# Patient Record
Sex: Female | Born: 1985
Health system: Southern US, Community
[De-identification: ages and names within clinical notes are randomized; demographics above are authoritative.]

## PROBLEM LIST (undated history)

## (undated) ENCOUNTER — Inpatient Hospital Stay (HOSPITAL_COMMUNITY): Payer: Self-pay

## (undated) DIAGNOSIS — D649 Anemia, unspecified: Secondary | ICD-10-CM

## (undated) DIAGNOSIS — Z8619 Personal history of other infectious and parasitic diseases: Secondary | ICD-10-CM

## (undated) DIAGNOSIS — J40 Bronchitis, not specified as acute or chronic: Secondary | ICD-10-CM

## (undated) DIAGNOSIS — F419 Anxiety disorder, unspecified: Secondary | ICD-10-CM

## (undated) DIAGNOSIS — F32A Depression, unspecified: Secondary | ICD-10-CM

## (undated) DIAGNOSIS — F1020 Alcohol dependence, uncomplicated: Secondary | ICD-10-CM

## (undated) DIAGNOSIS — O009 Unspecified ectopic pregnancy without intrauterine pregnancy: Secondary | ICD-10-CM

## (undated) HISTORY — PX: NO PAST SURGERIES: SHX2092

## (undated) HISTORY — DX: Personal history of other infectious and parasitic diseases: Z86.19

---

## 2007-06-11 ENCOUNTER — Emergency Department (HOSPITAL_COMMUNITY): Admission: EM | Admit: 2007-06-11 | Discharge: 2007-06-11 | Payer: Self-pay | Admitting: Emergency Medicine

## 2008-09-06 ENCOUNTER — Emergency Department (HOSPITAL_COMMUNITY): Admission: EM | Admit: 2008-09-06 | Discharge: 2008-09-06 | Payer: Self-pay | Admitting: Emergency Medicine

## 2009-06-29 ENCOUNTER — Emergency Department (HOSPITAL_COMMUNITY): Admission: EM | Admit: 2009-06-29 | Discharge: 2009-06-29 | Payer: Self-pay | Admitting: Family Medicine

## 2010-03-14 ENCOUNTER — Inpatient Hospital Stay (HOSPITAL_COMMUNITY): Admission: AD | Admit: 2010-03-14 | Discharge: 2010-03-14 | Payer: Self-pay | Admitting: Obstetrics and Gynecology

## 2010-10-17 ENCOUNTER — Inpatient Hospital Stay (HOSPITAL_COMMUNITY): Admission: AD | Admit: 2010-10-17 | Discharge: 2010-10-17 | Payer: Self-pay | Admitting: Obstetrics & Gynecology

## 2010-10-29 ENCOUNTER — Inpatient Hospital Stay (HOSPITAL_COMMUNITY)
Admission: AD | Admit: 2010-10-29 | Discharge: 2010-10-29 | Payer: Self-pay | Source: Home / Self Care | Admitting: Obstetrics and Gynecology

## 2010-11-14 ENCOUNTER — Inpatient Hospital Stay (HOSPITAL_COMMUNITY)
Admission: AD | Admit: 2010-11-14 | Discharge: 2010-11-17 | Payer: Self-pay | Source: Home / Self Care | Attending: Obstetrics and Gynecology | Admitting: Obstetrics and Gynecology

## 2011-02-11 LAB — CBC
HCT: 27.1 % — ABNORMAL LOW (ref 36.0–46.0)
MCHC: 31.4 g/dL (ref 30.0–36.0)
MCV: 77.2 fL — ABNORMAL LOW (ref 78.0–100.0)
Platelets: 168 10*3/uL (ref 150–400)

## 2011-02-12 LAB — CBC
HCT: 35.3 % — ABNORMAL LOW (ref 36.0–46.0)
MCH: 25.7 pg — ABNORMAL LOW (ref 26.0–34.0)
MCHC: 31.3 g/dL (ref 30.0–36.0)
MCV: 82.2 fL (ref 78.0–100.0)
Platelets: 247 10*3/uL (ref 150–400)
WBC: 11.2 10*3/uL — ABNORMAL HIGH (ref 4.0–10.5)

## 2011-02-20 LAB — URINALYSIS, ROUTINE W REFLEX MICROSCOPIC
Glucose, UA: NEGATIVE mg/dL
Ketones, ur: 15 mg/dL — AB
Nitrite: NEGATIVE
Urobilinogen, UA: 1 mg/dL (ref 0.0–1.0)
pH: 6 (ref 5.0–8.0)

## 2011-02-20 LAB — WET PREP, GENITAL
Trich, Wet Prep: NONE SEEN
Yeast Wet Prep HPF POC: NONE SEEN

## 2011-02-20 LAB — ABO/RH: ABO/RH(D): O POS

## 2011-02-20 LAB — POCT PREGNANCY, URINE: Preg Test, Ur: POSITIVE

## 2011-11-11 ENCOUNTER — Emergency Department (INDEPENDENT_AMBULATORY_CARE_PROVIDER_SITE_OTHER)
Admission: EM | Admit: 2011-11-11 | Discharge: 2011-11-11 | Disposition: A | Discharge status: Home / Self Care | Payer: BC Managed Care – PPO | Source: Home / Self Care | Attending: Emergency Medicine | Admitting: Emergency Medicine

## 2011-11-11 ENCOUNTER — Encounter: Payer: Self-pay | Admitting: *Deleted

## 2011-11-11 DIAGNOSIS — L738 Other specified follicular disorders: Secondary | ICD-10-CM

## 2011-11-11 DIAGNOSIS — L739 Follicular disorder, unspecified: Secondary | ICD-10-CM

## 2011-11-11 DIAGNOSIS — L678 Other hair color and hair shaft abnormalities: Secondary | ICD-10-CM

## 2011-11-11 HISTORY — DX: Bronchitis, not specified as acute or chronic: J40

## 2011-11-11 MED ORDER — MUPIROCIN CALCIUM 2 % EX CREA: TOPICAL_CREAM | Freq: Three times a day (TID) | CUTANEOUS | Status: AC

## 2011-11-11 NOTE — ED Notes (Signed)
Pt  Has  sevearl  Small  Area  On  Face  And   Scalp  She  Wants  evaul  For possible  mrsa   -  Her  Child  Has  mrsa  infection

## 2011-11-11 NOTE — ED Provider Notes (Signed)
History     CSN: [id number] Arrival date & time: 11/11/2011  3:33 PM   First MD Initiated Contact with Patient 11/11/11 1409      Chief Complaint  Patient presents with  . Facial Swelling    (Consider location/radiation/quality/duration/timing/severity/associated sxs/prior treatment) HPI Comments: 4 days of tender small swelling on left temple. No redness,drainage, fevers.  no new lotions, soaps, hair pulling, trauma to area. Also c/o painless small "bump" on left eyelid present and unchanged for 2 months. is concerned that these lesions are mrsa or shingles. 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  Patient is a 25 y.o. female presenting with rash. The history is provided by the patient.  Rash  The current episode started more than 2 days ago. The problem has not changed since onset.The problem is associated with nothing. There has been no fever. The rash is present on the scalp. Pertinent negatives include no blisters, no itching, no pain and no weeping. She has tried nothing for the symptoms.    Past Medical History  Diagnosis Date  . Bronchitis     History reviewed. No pertinent past surgical history.  History reviewed. No pertinent family history.  History  Substance Use Topics  . Smoking status: Current Some Day Smoker  . Smokeless tobacco: Not on file  . Alcohol Use: No    OB History    Grav Para Term Preterm Abortions TAB SAB Ect Mult Living                  Review of Systems  Constitutional: Negative for fever and fatigue.  HENT: Negative for ear pain and facial swelling.   Gastrointestinal: Negative for nausea and vomiting.  Skin: Positive for rash. Negative for color change, itching and wound.  Neurological: Negative for headaches.    Allergies  Citrus  Home Medications   Current Outpatient Rx  Name Route Sig Dispense Refill  . MUPIROCIN CALCIUM 2 % EX CREA Topical Apply topically 3 (three) times daily. 15 g 0      BP 122/62  Pulse 90  Temp(Src) 98.7 F (37.1 C) (Oral)  Resp 18  SpO2 100%  LMP 11/11/2011  Physical Exam  Nursing note and vitals reviewed. Constitutional: She is oriented to person, place, and time. She appears well-developed and well-nourished. No distress.  HENT:  Head: Normocephalic and atraumatic.  Eyes: Conjunctivae and EOM are normal. Pupils are equal, round, and reactive to light.  Neck: Normal range of motion. Neck supple.  Cardiovascular: Regular rhythm.   Pulmonary/Chest: Effort normal.  Abdominal: She exhibits no distension.  Musculoskeletal: Normal range of motion.  Lymphadenopathy:    She has no cervical adenopathy.  Neurological: She is alert and oriented to person, place, and time.  Skin: Skin is warm and dry.       Pimple left temple no expressable drainage, no surrounding redness, central fluctuance. Small skin tag on left upper eyelid.   Psychiatric: She has a normal mood and affect. Her behavior is normal. Judgment and thought content normal.    ED Course  Procedures (including critical care time)  Labs Reviewed - No data to display No results found.   1. Folliculitis     MDM    Luiz Blare, MD 11/11/11 [phone]

## 2012-05-02 ENCOUNTER — Emergency Department (HOSPITAL_COMMUNITY)
Admission: EM | Admit: 2012-05-02 | Discharge: 2012-05-02 | Disposition: A | Discharge status: Home / Self Care | Payer: No Typology Code available for payment source | Attending: Emergency Medicine | Admitting: Emergency Medicine

## 2012-05-02 ENCOUNTER — Encounter (HOSPITAL_COMMUNITY): Payer: Self-pay | Admitting: *Deleted

## 2012-05-02 DIAGNOSIS — M545 Low back pain, unspecified: Secondary | ICD-10-CM | POA: Insufficient documentation

## 2012-05-02 DIAGNOSIS — M542 Cervicalgia: Secondary | ICD-10-CM | POA: Insufficient documentation

## 2012-05-02 DIAGNOSIS — Z043 Encounter for examination and observation following other accident: Secondary | ICD-10-CM | POA: Insufficient documentation

## 2012-05-02 DIAGNOSIS — Z87891 Personal history of nicotine dependence: Secondary | ICD-10-CM | POA: Insufficient documentation

## 2012-05-02 MED ORDER — IBUPROFEN 800 MG PO TABS: 800.0000 mg | ORAL_TABLET | Freq: Three times a day (TID) | ORAL | Status: DC | PRN

## 2012-05-02 MED ORDER — METHOCARBAMOL 500 MG PO TABS: 1000.0000 mg | ORAL_TABLET | Freq: Four times a day (QID) | ORAL | Status: DC

## 2012-05-02 MED ORDER — IBUPROFEN 800 MG PO TABS
800.0000 mg | ORAL_TABLET | Freq: Once | ORAL | Status: AC
  Administered 2012-05-02: 800 mg via ORAL
  Filled 2012-05-02: qty 1
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
## 2012-05-02 NOTE — ED Provider Notes (Signed)
Medical screening examination/treatment/procedure(s) were performed by non-physician practitioner and as supervising physician I was immediately available for consultation/collaboration.  Doug Sou, MD 05/02/12 1733
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
## 2012-05-06 ENCOUNTER — Emergency Department (INDEPENDENT_AMBULATORY_CARE_PROVIDER_SITE_OTHER)
Admission: EM | Admit: 2012-05-06 | Discharge: 2012-05-06 | Disposition: A | Discharge status: Home / Self Care | Payer: BC Managed Care – PPO | Source: Home / Self Care | Attending: Emergency Medicine | Admitting: Emergency Medicine

## 2012-05-06 ENCOUNTER — Encounter (HOSPITAL_COMMUNITY): Payer: Self-pay | Admitting: Emergency Medicine

## 2012-05-06 DIAGNOSIS — S161XXA Strain of muscle, fascia and tendon at neck level, initial encounter: Secondary | ICD-10-CM

## 2012-05-06 DIAGNOSIS — S139XXA Sprain of joints and ligaments of unspecified parts of neck, initial encounter: Secondary | ICD-10-CM

## 2012-05-06 DIAGNOSIS — S335XXA Sprain of ligaments of lumbar spine, initial encounter: Secondary | ICD-10-CM

## 2012-05-06 MED ORDER — MELOXICAM 7.5 MG PO TABS: 7.5000 mg | ORAL_TABLET | Freq: Every day | ORAL | Status: AC

## 2012-05-06 MED ORDER — METHOCARBAMOL 500 MG PO TABS: 1000.0000 mg | ORAL_TABLET | Freq: Four times a day (QID) | ORAL | Status: AC
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
## 2012-05-06 NOTE — Discharge Instructions (Signed)
  As discussed if your pain persists beyond 10-12 days should followup with your primary care Dr. or an orthopedic doctor for guided physical therapy. Your exam was not consistent with fractures or dislocations. Your muscles are tense you should take the previously prescribed muscle relaxer along with his other medicines for pain and discomfort.    Back Exercises Back exercises help treat and prevent back injuries. The goal of back exercises is to increase the strength of your abdominal and back muscles and the flexibility of your back. These exercises should be started when you no longer have back pain. Back exercises include:  Pelvic Tilt. Lie on your back with your knees bent. Tilt your pelvis until the lower part of your back is against the floor. Hold this position 5 to 10 sec and repeat 5 to 10 times.   Knee to Chest. Pull first 1 knee up against your chest and hold for 20 to 30 seconds, repeat this with the other knee, and then both knees. This may be done with the other leg straight or bent, whichever feels better.   Sit-Ups or Curl-Ups. Bend your knees 90 degrees. Start with tilting your pelvis, and do a partial, slow sit-up, lifting your trunk only 30 to 45 degrees off the floor. Take at least 2 to 3 seconds for each sit-up. Do not do sit-ups with your knees out straight. If partial sit-ups are difficult, simply do the above but with only tightening your abdominal muscles and holding it as directed.   Hip-Lift. Lie on your back with your knees flexed 90 degrees. Push down with your feet and shoulders as you raise your hips a couple inches off the floor; hold for 10 seconds, repeat 5 to 10 times.   Back arches. Lie on your stomach, propping yourself up on bent elbows. Slowly press on your hands, causing an arch in your low back. Repeat 3 to 5 times. Any initial stiffness and discomfort should lessen with repetition over time.   Shoulder-Lifts. Lie face down with arms beside your body. Keep  hips and torso pressed to floor as you slowly lift your head and shoulders off the floor.  Do not overdo your exercises, especially in the beginning. Exercises may cause you some mild back discomfort which lasts for a few minutes; however, if the pain is more severe, or lasts for more than 15 minutes, do not continue exercises until you see your caregiver. Improvement with exercise therapy for back problems is slow.  See your caregivers for assistance with developing a proper back exercise program. Document Released: 12/26/2004 Document Revised: 11/07/2011 Document Reviewed: 11/18/2005 [facility] Patient Information 2012 [place], [place].

## 2013-12-28 ENCOUNTER — Emergency Department (INDEPENDENT_AMBULATORY_CARE_PROVIDER_SITE_OTHER): Payer: BC Managed Care – PPO

## 2013-12-28 ENCOUNTER — Encounter (HOSPITAL_COMMUNITY): Payer: Self-pay | Admitting: Emergency Medicine

## 2013-12-28 ENCOUNTER — Emergency Department (INDEPENDENT_AMBULATORY_CARE_PROVIDER_SITE_OTHER)
Admission: EM | Admit: 2013-12-28 | Discharge: 2013-12-28 | Disposition: A | Discharge status: Home / Self Care | Payer: BC Managed Care – PPO | Source: Home / Self Care | Attending: Family Medicine | Admitting: Family Medicine

## 2013-12-28 DIAGNOSIS — J189 Pneumonia, unspecified organism: Secondary | ICD-10-CM

## 2013-12-28 DIAGNOSIS — Z20828 Contact with and (suspected) exposure to other viral communicable diseases: Secondary | ICD-10-CM

## 2013-12-28 MED ORDER — AZITHROMYCIN 250 MG PO TABS: ORAL_TABLET | ORAL | Status: DC

## 2013-12-28 MED ORDER — IPRATROPIUM-ALBUTEROL 0.5-2.5 (3) MG/3ML IN SOLN
RESPIRATORY_TRACT | Status: AC
  Filled 2013-12-28: qty 3

## 2013-12-28 MED ORDER — OSELTAMIVIR PHOSPHATE 75 MG PO CAPS: 75.0000 mg | ORAL_CAPSULE | Freq: Two times a day (BID) | ORAL | Status: DC

## 2013-12-28 MED ORDER — IPRATROPIUM-ALBUTEROL 0.5-2.5 (3) MG/3ML IN SOLN
3.0000 mL | Freq: Once | RESPIRATORY_TRACT | Status: AC
  Administered 2013-12-28: 3 mL via RESPIRATORY_TRACT

## 2013-12-28 NOTE — ED Provider Notes (Signed)
CSN: [id number]     Arrival date & time 12/28/13  6962 History   First MD Initiated Contact with Patient 12/28/13 1010     Chief Complaint  Patient presents with  . URI   (Consider location/radiation/quality/duration/timing/severity/associated sxs/prior Treatment) HPI Comments: 28 year old female presents complaining of one and a half days of fever, chills, body aches, nasal congestion, rhinorrhea, cough, sore throat. Started as a productive cough has gotten worse. Fever started yesterday, up to 100.5 at home. She has taken Tylenol and NyQuil, and Mucinex. The shortness of breath and it was yesterday. The body aches are worse today than the previous 2 days.  Patient is a 28 y.o. female presenting with URI. The history is limited by a language barrier.  URI Presenting symptoms: congestion, cough, ear pain, fatigue, fever, rhinorrhea and sore throat   Associated symptoms: myalgias   Associated symptoms: no arthralgias and no wheezing     Past Medical History  Diagnosis Date  . Bronchitis    History reviewed. No pertinent past surgical history. Family History  Problem Relation Age of Onset  . Stroke Mother   . Hypertension Mother   . Migraines Mother   . Seizures Mother   . Diabetes Father   . Hyperlipidemia Father   . Hypertension Father   . Rashes / Skin problems Brother   . Seizures Brother   . Rheum arthritis Neg Hx   . Osteoarthritis Neg Hx   . Asthma Neg Hx   . Cancer Neg Hx   . Heart failure Neg Hx   . Thyroid disease Neg Hx    History  Substance Use Topics  . Smoking status: Former Smoker -- 0.50 packs/day    Quit date: 12/03/2007  . Smokeless tobacco: Not on file  . Alcohol Use: 4.2 oz/week    7 Glasses of wine per week   OB History   Grav Para Term Preterm Abortions TAB SAB Ect Mult Living   4 2 2  2           Review of Systems  Constitutional: Positive for fever, chills and fatigue.  HENT: Positive for congestion, ear pain, rhinorrhea and sore throat.  Negative for sinus pressure.   Eyes: Negative for visual disturbance.  Respiratory: Positive for cough. Negative for shortness of breath and wheezing.   Cardiovascular: Negative for chest pain, palpitations and leg swelling.  Gastrointestinal: Negative for nausea, vomiting, abdominal pain and diarrhea.  Endocrine: Negative for polydipsia and polyuria.  Genitourinary: Negative for dysuria, urgency and frequency.  Musculoskeletal: Positive for myalgias. Negative for arthralgias.  Skin: Negative for rash.  Neurological: Negative for dizziness, weakness and light-headedness.    Allergies  Citrus  Home Medications   Current Outpatient Rx  Name  Route  Sig  Dispense  Refill  . azithromycin (ZITHROMAX Z-PAK) 250 MG tablet      Use as directed   6 each   0   . oseltamivir (TAMIFLU) 75 MG capsule   Oral   Take 1 capsule (75 mg total) by mouth every 12 (twelve) hours.   10 capsule   0   . OVER THE COUNTER MEDICATION   Oral   Take 2 capsules by mouth as needed. "Fat Fighter pills", taken after each heavy meal.          BP 123/92  Pulse 78  Temp(Src) 99.4 F (37.4 C) (Oral)  Resp 18  SpO2 99%  LMP 12/28/2013 Physical Exam  Nursing note and vitals reviewed. Constitutional: She is oriented  to person, place, and time. Vital signs are normal. She appears well-developed and well-nourished. No distress.  HENT:  Head: Normocephalic and atraumatic.  Right Ear: External ear normal.  Left Ear: External ear normal.  Nose: Nose normal.  Mouth/Throat: Oropharynx is clear and moist. No oropharyngeal exudate.  Eyes: Conjunctivae are normal. Right eye exhibits no discharge. Left eye exhibits no discharge. No scleral icterus.  Neck: Normal range of motion. Neck supple.  Cardiovascular: Normal rate, regular rhythm and normal heart sounds.   Pulmonary/Chest: Effort normal. No respiratory distress. She has rhonchi in the right middle field and the left middle field.  Lymphadenopathy:     She has no cervical adenopathy.  Neurological: She is alert and oriented to person, place, and time. She has normal strength. Coordination normal.  Skin: Skin is warm and dry. No rash noted. She is not diaphoretic.  Psychiatric: She has a normal mood and affect. Judgment normal.    ED Course  Procedures (including critical care time) Labs Review Labs Reviewed - No data to display Imaging Review Dg Chest 2 View  12/28/2013   CLINICAL DATA:  Cough and congestion with flu-like symptoms for 2 days  EXAM: CHEST  2 VIEW  COMPARISON:  None.  FINDINGS: The lungs are adequately inflated. There is increased density in the inferior aspect of the right upper lobe or superior segment of the right lower lobe consistent with atelectasis or early pneumonia. The left lung is clear. There is no pleural effusion or pneumothorax. The mediastinum is normal in width. The cardiac silhouette is normal in size. The observed portions of the bony thorax appear normal.  IMPRESSION: There is increased density consistent with pneumonia or atelectasis in the posterior aspect of the right mid lung as described above. Otherwise the examination is within the limits of normal.   Electronically Signed   By: David  [place]azil[place]   On: 12/28/2013 10:51      MDM   1. CAP (community acquired pneumonia)   2. Exposure to influenza    Chest x-ray reveals minimal right middle lung pneumonia. Treating for CAP and for flu given exposure to her son, just Dx with flu-like illness.  OTC medications as needed for symptoms, advised Mucinex and can use Delsym for cough.  She had significant symptomatic improvement with a duo neb, however she declines any albuterol prescription, she says she has plenty at home.   Meds ordered this encounter  Medications  . ipratropium-albuterol (DUONEB) 0.5-2.5 (3) MG/3ML nebulizer solution 3 mL    Sig:   . oseltamivir (TAMIFLU) 75 MG capsule    Sig: Take 1 capsule (75 mg total) by mouth every 12 (twelve)  hours.    Dispense:  10 capsule    Refill:  0  . azithromycin (ZITHROMAX Z-PAK) 250 MG tablet    Sig: Use as directed    Dispense:  6 each    Refill:  0        Graylon GoodZachary H Brennyn Ortlieb, PA-C 12/28/13 1153

## 2013-12-28 NOTE — Discharge Instructions (Signed)

## 2013-12-28 NOTE — ED Notes (Signed)
C/o cold and flu sx that include cough with mucous, chills, sweating, muscle aches, and congestion that started 2 days ago. Pain is 4/10. Denies any n/v/d, stated she has had more bowel movements yesterday than normal, but no diarrhea. Stated she has taking a total of 60 mg yesterday of Top Care with no relief. Written by: Marga MelnickQuaNeisha Jones, SMA

## 2013-12-29 NOTE — ED Provider Notes (Signed)
Medical screening examination/treatment/procedure(s) were performed by a resident physician or non-physician practitioner and as the supervising physician I was immediately available for consultation/collaboration.  Kalea Perine, MD    Jerney Baksh S Amoree Newlon, MD 12/29/13 0754 

## 2014-02-04 ENCOUNTER — Encounter (HOSPITAL_COMMUNITY): Payer: Self-pay | Admitting: Emergency Medicine

## 2014-02-04 ENCOUNTER — Emergency Department (HOSPITAL_COMMUNITY)
Admission: EM | Admit: 2014-02-04 | Discharge: 2014-02-04 | Disposition: A | Discharge status: Home / Self Care | Payer: BC Managed Care – PPO | Source: Home / Self Care | Attending: Family Medicine | Admitting: Family Medicine

## 2014-02-04 DIAGNOSIS — N390 Urinary tract infection, site not specified: Secondary | ICD-10-CM

## 2014-02-04 LAB — POCT URINALYSIS DIP (DEVICE)
BILIRUBIN URINE: NEGATIVE
Glucose, UA: NEGATIVE mg/dL
KETONES UR: NEGATIVE mg/dL
NITRITE: NEGATIVE
PROTEIN: 30 mg/dL — AB
SPECIFIC GRAVITY, URINE: 1.02 (ref 1.005–1.030)
Urobilinogen, UA: 1 mg/dL (ref 0.0–1.0)
pH: 8.5 — ABNORMAL HIGH (ref 5.0–8.0)

## 2014-02-04 LAB — POCT PREGNANCY, URINE: Preg Test, Ur: NEGATIVE

## 2014-02-04 MED ORDER — CEPHALEXIN 500 MG PO CAPS: 500.0000 mg | ORAL_CAPSULE | Freq: Four times a day (QID) | ORAL | Status: DC

## 2014-02-04 NOTE — Discharge Instructions (Signed)
Take all of medicine as directed, drink lots of fluids, see your doctor if further problems. °

## 2014-02-04 NOTE — ED Notes (Signed)
Pt  Reports  Symptoms  Of  Low  abd  Pain  With  Hematuria            That  She  Noticed  yest   -  Pt  States  The  Blood  Is   Prevalent  At the  End  Of  micturation          denys  Any  Discharge

## 2014-02-04 NOTE — ED Provider Notes (Signed)
CSN: [id number]     Arrival date & time 02/04/14  [id number] History   First MD Initiated Contact with Patient 02/04/14 [phone]     Chief Complaint  Patient presents with  . Hematuria   (Consider location/radiation/quality/duration/timing/severity/associated sxs/prior Treatment) Patient is a 28 y.o. female presenting with hematuria. The history is provided by the patient.  Hematuria This is a new problem. The current episode started yesterday. The problem has been gradually worsening. Associated symptoms include abdominal pain.    Past Medical History  Diagnosis Date  . Bronchitis    History reviewed. No pertinent past surgical history. Family History  Problem Relation Age of Onset  . Stroke Mother   . Hypertension Mother   . Migraines Mother   . Seizures Mother   . Diabetes Father   . Hyperlipidemia Father   . Hypertension Father   . Rashes / Skin problems Brother   . Seizures Brother   . Rheum arthritis Neg Hx   . Osteoarthritis Neg Hx   . Asthma Neg Hx   . Cancer Neg Hx   . Heart failure Neg Hx   . Thyroid disease Neg Hx    History  Substance Use Topics  . Smoking status: Former Smoker -- 0.50 packs/day    Quit date: 12/03/2007  . Smokeless tobacco: Not on file  . Alcohol Use: 4.2 oz/week    7 Glasses of wine per week   OB History   Grav Para Term Preterm Abortions TAB SAB Ect Mult Living   4 2 2  2           Review of Systems  Constitutional: Negative.   Gastrointestinal: Positive for abdominal pain.  Genitourinary: Positive for dysuria, urgency, frequency, hematuria and pelvic pain. Negative for vaginal bleeding, vaginal discharge and menstrual problem.    Allergies  Citrus  Home Medications   Current Outpatient Rx  Name  Route  Sig  Dispense  Refill  . azithromycin (ZITHROMAX Z-PAK) 250 MG tablet      Use as directed   6 each   0   . cephALEXin (KEFLEX) 500 MG capsule   Oral   Take 1 capsule (500 mg total) by mouth 4 (four) times daily. Take all of  medicine and drink lots of fluids   20 capsule   0   . oseltamivir (TAMIFLU) 75 MG capsule   Oral   Take 1 capsule (75 mg total) by mouth every 12 (twelve) hours.   10 capsule   0   . OVER THE COUNTER MEDICATION   Oral   Take 2 capsules by mouth as needed. "Fat Fighter pills", taken after each heavy meal.          There were no vitals taken for this visit. Physical Exam  Nursing note and vitals reviewed. Constitutional: She is oriented to person, place, and time. She appears well-developed and well-nourished.  Abdominal: Soft. Bowel sounds are normal. She exhibits no distension and no mass. There is tenderness in the suprapubic area. There is no rigidity, no rebound, no guarding and no CVA tenderness.    Neurological: She is alert and oriented to person, place, and time.  Skin: Skin is warm and dry.    ED Course  Procedures (including critical care time) Labs Review Labs Reviewed  POCT URINALYSIS DIP (DEVICE) - Abnormal; Notable for the following:    Hgb urine dipstick MODERATE (*)    pH 8.5 (*)    Protein, ur 30 (*)    Leukocytes,  UA SMALL (*)    All other components within normal limits  POCT PREGNANCY, URINE   Imaging Review No results found.   MDM   1. UTI (lower urinary tract infection)    U/a as noted    Linna Hoff, MD 02/04/14 1017

## 2014-07-11 ENCOUNTER — Inpatient Hospital Stay (HOSPITAL_COMMUNITY): Payer: BC Managed Care – PPO

## 2014-07-11 ENCOUNTER — Inpatient Hospital Stay (HOSPITAL_COMMUNITY)
Admission: AD | Admit: 2014-07-11 | Discharge: 2014-07-11 | Disposition: A | Discharge status: Home / Self Care | Payer: BC Managed Care – PPO | Source: Ambulatory Visit | Attending: Obstetrics & Gynecology | Admitting: Obstetrics & Gynecology

## 2014-07-11 ENCOUNTER — Encounter (HOSPITAL_COMMUNITY): Payer: Self-pay | Admitting: *Deleted

## 2014-07-11 DIAGNOSIS — Z87891 Personal history of nicotine dependence: Secondary | ICD-10-CM | POA: Insufficient documentation

## 2014-07-11 DIAGNOSIS — N83209 Unspecified ovarian cyst, unspecified side: Secondary | ICD-10-CM

## 2014-07-11 DIAGNOSIS — O34599 Maternal care for other abnormalities of gravid uterus, unspecified trimester: Secondary | ICD-10-CM | POA: Insufficient documentation

## 2014-07-11 DIAGNOSIS — O26899 Other specified pregnancy related conditions, unspecified trimester: Secondary | ICD-10-CM | POA: Diagnosis not present

## 2014-07-11 DIAGNOSIS — O99891 Other specified diseases and conditions complicating pregnancy: Secondary | ICD-10-CM | POA: Insufficient documentation

## 2014-07-11 DIAGNOSIS — O9989 Other specified diseases and conditions complicating pregnancy, childbirth and the puerperium: Principal | ICD-10-CM

## 2014-07-11 DIAGNOSIS — R109 Unspecified abdominal pain: Secondary | ICD-10-CM

## 2014-07-11 HISTORY — DX: Anemia, unspecified: D64.9

## 2014-07-11 LAB — CBC
HEMATOCRIT: 37.1 % (ref 36.0–46.0)
Hemoglobin: 11.6 g/dL — ABNORMAL LOW (ref 12.0–15.0)
MCH: 24.8 pg — ABNORMAL LOW (ref 26.0–34.0)
MCHC: 31.3 g/dL (ref 30.0–36.0)
MCV: 79.3 fL (ref 78.0–100.0)
Platelets: 321 10*3/uL (ref 150–400)
RBC: 4.68 MIL/uL (ref 3.87–5.11)
RDW: 15.2 % (ref 11.5–15.5)
WBC: 7.9 10*3/uL (ref 4.0–10.5)

## 2014-07-11 LAB — URINALYSIS, ROUTINE W REFLEX MICROSCOPIC
BILIRUBIN URINE: NEGATIVE
GLUCOSE, UA: NEGATIVE mg/dL
HGB URINE DIPSTICK: NEGATIVE
Ketones, ur: NEGATIVE mg/dL
Leukocytes, UA: NEGATIVE
Nitrite: NEGATIVE
PH: 7 (ref 5.0–8.0)
Protein, ur: NEGATIVE mg/dL
SPECIFIC GRAVITY, URINE: 1.015 (ref 1.005–1.030)
UROBILINOGEN UA: 1 mg/dL (ref 0.0–1.0)

## 2014-07-11 LAB — POCT PREGNANCY, URINE: PREG TEST UR: POSITIVE — AB

## 2014-07-11 LAB — HCG, QUANTITATIVE, PREGNANCY: hCG, Beta Chain, Quant, S: 523 m[IU]/mL — ABNORMAL HIGH (ref <5)

## 2014-07-11 LAB — WET PREP, GENITAL
CLUE CELLS WET PREP: NONE SEEN
TRICH WET PREP: NONE SEEN
Yeast Wet Prep HPF POC: NONE SEEN

## 2014-07-11 NOTE — Discharge Instructions (Signed)

## 2014-07-11 NOTE — MAU Note (Signed)
Pt LMP 06/06/14, +UPT at home.  Having lower abd pain, denies bleeding.

## 2014-07-11 NOTE — MAU Provider Note (Signed)
History     CSN: [id number]  Arrival date and time: 07/11/14 1339   First Provider Initiated Contact with Patient 07/11/14 1432      Chief Complaint  Patient presents with  . Possible Pregnancy  . Abdominal Pain   Possible Pregnancy Pertinent negatives include no abdominal pain (Occasional gas like abdominal pain. ), chills, fever, nausea or vomiting.  Abdominal Pain Pertinent negatives include no constipation, diarrhea, dysuria, fever, frequency, hematuria, nausea or vomiting.    Ms. Diana Hudson is a 28 y.o. female [phone] at [id number] who presents to MAU for a confirmation of pregnancy and to find out how far along pregnant she is. She had some "gas pain" on Saturday (2 days ago) however is not having pain today. She denies bleeding or abnormal vaginal discharge.  She is certain of her last menstrual cycle and has periods regularly.   OB History   Grav Para Term Preterm Abortions TAB SAB Ect Mult Living   5 2 2  2     2       Past Medical History  Diagnosis Date  . Bronchitis   . Anemia     Past Surgical History  Procedure Laterality Date  . No past surgeries      Family History  Problem Relation Age of Onset  . Stroke Mother   . Hypertension Mother   . Migraines Mother   . Seizures Mother   . Diabetes Father   . Hyperlipidemia Father   . Hypertension Father   . Rashes / Skin problems Brother   . Seizures Brother   . Rheum arthritis Neg Hx   . Osteoarthritis Neg Hx   . Asthma Neg Hx   . Cancer Neg Hx   . Heart failure Neg Hx   . Thyroid disease Neg Hx     History  Substance Use Topics  . Smoking status: Former Smoker -- 0.50 packs/day    Quit date: 12/03/2007  . Smokeless tobacco: Not on file  . Alcohol Use: 4.2 oz/week    7 Glasses of wine per week     Comment: stopped with +UPT    Allergies:  Allergies  Allergen Reactions  . Citrus Other (See Comments)    Gets blisters around her mouth.    Prescriptions prior to admission  Medication Sig  Dispense Refill  . triamcinolone ointment (KENALOG) 0.1 % Apply 1 application topically as needed (rash and itching).       Results for orders placed during the hospital encounter of 07/11/14 (from the past 48 hour(s))  URINALYSIS, ROUTINE W REFLEX MICROSCOPIC     Status: None   Collection Time    07/11/14  1:45 PM      Result Value Ref Range   Color, Urine YELLOW  YELLOW   APPearance CLEAR  CLEAR   Specific Gravity, Urine 1.015  1.005 - 1.030   pH 7.0  5.0 - 8.0   Glucose, UA NEGATIVE  NEGATIVE mg/dL   Hgb urine dipstick NEGATIVE  NEGATIVE   Bilirubin Urine NEGATIVE  NEGATIVE   Ketones, ur NEGATIVE  NEGATIVE mg/dL   Protein, ur NEGATIVE  NEGATIVE mg/dL   Urobilinogen, UA 1.0  0.0 - 1.0 mg/dL   Nitrite NEGATIVE  NEGATIVE   Leukocytes, UA NEGATIVE  NEGATIVE   Comment: MICROSCOPIC NOT DONE ON URINES WITH NEGATIVE PROTEIN, BLOOD, LEUKOCYTES, NITRITE, OR GLUCOSE <1000 mg/dL.  POCT PREGNANCY, URINE     Status: Abnormal   Collection Time    07/11/14  1:51  PM      Result Value Ref Range   Preg Test, Ur POSITIVE (*) NEGATIVE   Comment:            THE SENSITIVITY OF THIS     METHODOLOGY IS >24 mIU/mL  CBC     Status: Abnormal   Collection Time    07/11/14  3:10 PM      Result Value Ref Range   WBC 7.9  4.0 - 10.5 K/uL   RBC 4.68  3.87 - 5.11 MIL/uL   Hemoglobin 11.6 (*) 12.0 - 15.0 g/dL   HCT 16.1  09.6 - 04.5 %   MCV 79.3  78.0 - 100.0 fL   MCH 24.8 (*) 26.0 - 34.0 pg   MCHC 31.3  30.0 - 36.0 g/dL   RDW 40.9  81.1 - 91.4 %   Platelets 321  150 - 400 K/uL  HCG, QUANTITATIVE, PREGNANCY     Status: Abnormal   Collection Time    07/11/14  3:10 PM      Result Value Ref Range   hCG, Beta Chain, Quant, S 523 (*) <5 mIU/mL   Comment:              GEST. AGE      CONC.  (mIU/mL)       <=1 WEEK        5 - 50         2 WEEKS       50 - 500         3 WEEKS       100 - 10,000         4 WEEKS     1,000 - 30,000         5 WEEKS     3,500 - 115,000       6-8 WEEKS     12,000 - 270,000         12 WEEKS     15,000 - 220,000                FEMALE AND NON-PREGNANT FEMALE:         LESS THAN 5 mIU/mL  WET PREP, GENITAL     Status: Abnormal   Collection Time    07/11/14  3:24 PM      Result Value Ref Range   Yeast Wet Prep HPF POC NONE SEEN  NONE SEEN   Trich, Wet Prep NONE SEEN  NONE SEEN   Clue Cells Wet Prep HPF POC NONE SEEN  NONE SEEN   WBC, Wet Prep HPF POC FEW (*) NONE SEEN   Comment: FEW BACTERIA SEEN   US Ob Comp Less 14 Wks  07/11/2014   CLINICAL DATA:  Positive HCG with pelvic pain  EXAM: OBSTETRIC <14 WK [place] AND TRANSVAGINAL OB US  TECHNIQUE: Study was performed transabdominally to optimize pelvic field of view evaluation and transvaginally to optimize internal visceral architecture evaluation.  COMPARISON:  None.  FINDINGS: Intrauterine gestational sac: Not visualized  Yolk sac:  Not visualized  Embryo:  Not visualized  Maternal uterus/adnexae: Uterus measures 5.7 x 5.7 x 4.6 cm. Uterus is anteverted. There is no demonstrable intrauterine mass. Endometrium is thickened measuring 19 mm with a smooth contour.  Left ovary measures 2.9 x 1.8 x 1.7 cm. Right ovary measures 7.6 x 6.6 x 6.4 cm. There is a mildly complex cyst in the right ovary measuring 5.9 x 5.8 x 6.3 cm. There is no other pelvic or  adnexal mass. No free pelvic fluid.  IMPRESSION: Probable dominant hemorrhagic cyst right ovary. No other pelvic masses. No intrauterine gestation seen at this time. Differential considerations for this circumstance include gestation too early to be seen by either transabdominal technique; recent spontaneous abortion, or possible ectopic gestation. Given this circumstance, close clinical and laboratory evaluation with beta HCG values advised. Repeat imaging in part will be depend on beta HCG values. The dominant cystic area on the right will warrant surveillance apart from beta HCG values. In this regard, a followup ultrasound in 4-6 weeks to evaluate this sizable mildly complex cystic mass  would be advisable. If symptoms persist, earlier surveillance of this lesion may be advisable.   Electronically Signed   By: Bretta Bang M.D.   On: 07/11/2014 16:30   US Ob Transvaginal  07/11/2014   CLINICAL DATA:  Positive HCG with pelvic pain  EXAM: OBSTETRIC <14 WK [place] AND TRANSVAGINAL OB US  TECHNIQUE: Study was performed transabdominally to optimize pelvic field of view evaluation and transvaginally to optimize internal visceral architecture evaluation.  COMPARISON:  None.  FINDINGS: Intrauterine gestational sac: Not visualized  Yolk sac:  Not visualized  Embryo:  Not visualized  Maternal uterus/adnexae: Uterus measures 5.7 x 5.7 x 4.6 cm. Uterus is anteverted. There is no demonstrable intrauterine mass. Endometrium is thickened measuring 19 mm with a smooth contour.  Left ovary measures 2.9 x 1.8 x 1.7 cm. Right ovary measures 7.6 x 6.6 x 6.4 cm. There is a mildly complex cyst in the right ovary measuring 5.9 x 5.8 x 6.3 cm. There is no other pelvic or adnexal mass. No free pelvic fluid.  IMPRESSION: Probable dominant hemorrhagic cyst right ovary. No other pelvic masses. No intrauterine gestation seen at this time. Differential considerations for this circumstance include gestation too early to be seen by either transabdominal technique; recent spontaneous abortion, or possible ectopic gestation. Given this circumstance, close clinical and laboratory evaluation with beta HCG values advised. Repeat imaging in part will be depend on beta HCG values. The dominant cystic area on the right will warrant surveillance apart from beta HCG values. In this regard, a followup ultrasound in 4-6 weeks to evaluate this sizable mildly complex cystic mass would be advisable. If symptoms persist, earlier surveillance of this lesion may be advisable.   Electronically Signed   By: Bretta Bang M.D.   On: 07/11/2014 16:30     Review of Systems  Constitutional: Negative for fever and chills.  Gastrointestinal:  Negative for nausea, vomiting, abdominal pain (Occasional gas like abdominal pain. ), diarrhea and constipation.  Genitourinary: Negative for dysuria, urgency, frequency and hematuria.       No vaginal discharge. No vaginal bleeding. No dysuria.    Physical Exam   Blood pressure 129/71, pulse 79, temperature 98.7 F (37.1 C), temperature source Oral, resp. rate 18, height 5\' 3"  (1.6 m), weight 99.973 kg (220 lb 6.4 oz), last menstrual period 06/06/2014.  Physical Exam  Constitutional: She is oriented to person, place, and time. She appears well-developed and well-nourished. No distress.  Neck: Neck supple.  Respiratory: Effort normal.  GI: Soft. There is tenderness.  Right-mid abdominal tenderness. Patient rates her pain 5/10 on exam when abdomen is palpated.   Genitourinary:  Speculum exam: Vagina - Small amount of creamy discharge, no odor Cervix - No contact bleeding, no active bleeding  Bimanual exam: Cervix closed Uterus non tender, normal size Adnexa non tender, no masses bilaterally. No tenderness in right adnexa  GC/Chlam,  wet prep done Chaperone present for exam.   Musculoskeletal: Normal range of motion.  Neurological: She is alert and oriented to person, place, and time.  Skin: Skin is warm. She is not diaphoretic.  Psychiatric: Her behavior is normal.    MAU Course  Procedures None   MDM Wet prep GC CBC Beta hcg Ultrasound   O positive blood type  Discussed US findings with Dr. Debroah Loop. Plan to follow the patient with Beta hcg levels to rule out ectopic pregnancy. Pt will need to have follow up for cyst in the next 6-8 weeks. Pt plans to see [facility] for her pregnancy and is instructed to discussed follow up at her first OB appointment.   Assessment and Plan   A:  Right hemorraghic cyst  Abdominal pain in pregnancy; cannot exclude ectopic pregnancy   P:  Discharge home in stable condition Return to MAU as needed, if symptoms  worsen Ectopic precautions discussed Pelvic rest Ok to take tylenol as needed, as directed on the bottle for pain.  Return to MAU in 48 hours for repeat Beta hcg level.   Iona Hansen Rasch, NP  07/11/2014, 7:32 PM

## 2014-07-12 LAB — GC/CHLAMYDIA PROBE AMP
CT PROBE, AMP APTIMA: NEGATIVE
GC Probe RNA: NEGATIVE

## 2014-07-13 ENCOUNTER — Inpatient Hospital Stay (HOSPITAL_COMMUNITY)
Admission: AD | Admit: 2014-07-13 | Discharge: 2014-07-13 | Disposition: A | Discharge status: Home / Self Care | Payer: BC Managed Care – PPO | Source: Ambulatory Visit | Attending: Obstetrics & Gynecology | Admitting: Obstetrics & Gynecology

## 2014-07-13 DIAGNOSIS — O9989 Other specified diseases and conditions complicating pregnancy, childbirth and the puerperium: Principal | ICD-10-CM

## 2014-07-13 DIAGNOSIS — O99891 Other specified diseases and conditions complicating pregnancy: Secondary | ICD-10-CM | POA: Diagnosis not present

## 2014-07-13 DIAGNOSIS — O0281 Inappropriate change in quantitative human chorionic gonadotropin (hCG) in early pregnancy: Secondary | ICD-10-CM

## 2014-07-13 LAB — HCG, QUANTITATIVE, PREGNANCY: hCG, Beta Chain, Quant, S: 1490 m[IU]/mL — ABNORMAL HIGH (ref <5)

## 2014-07-13 NOTE — MAU Note (Signed)
Feeling much better than on Monday. Denies any pain or bleeding.

## 2014-07-13 NOTE — MAU Provider Note (Signed)
Subjective: [facility] b[facility].  She denies further pain, bleeding.    Objective:  Quant on 07/11/14 was 523. Results for orders placed during the hospital encounter of 07/13/14 (from the past 24 hour(s))  HCG, QUANTITATIVE, PREGNANCY     Status: Abnormal   Collection Time    07/13/14  5:20 PM      Result Value Ref Range   hCG, Beta Chain, Quant, S 1490 (*) <5 mIU/mL    Filed Vitals:   07/13/14 1830  BP: 125/84  Pulse: 73  Temp: 98.9 F (37.2 C)  Resp: 18   General: well developed, well nourished, no acute distress Abdomen: without tenderness  Assessment: 5 weeks 2 days appropriate rise in bHCG level.    Plan: Schedule follow up ultrasound in 7 days.  U/S to call pt. Return to MAU for pain or bleeding.   [facility] Notify ob/gyn of cyst.    Nada MaclachlanKaren Teague Clark, PA-C 07/13/2014 7:28 PM

## 2014-07-26 ENCOUNTER — Encounter (HOSPITAL_COMMUNITY): Payer: Self-pay | Admitting: *Deleted

## 2014-07-26 ENCOUNTER — Inpatient Hospital Stay (HOSPITAL_COMMUNITY)
Admission: AD | Admit: 2014-07-26 | Discharge: 2014-07-26 | Disposition: A | Discharge status: Home / Self Care | Payer: BC Managed Care – PPO | Source: Ambulatory Visit | Attending: Obstetrics & Gynecology | Admitting: Obstetrics & Gynecology

## 2014-07-26 ENCOUNTER — Inpatient Hospital Stay (HOSPITAL_COMMUNITY): Payer: BC Managed Care – PPO

## 2014-07-26 DIAGNOSIS — Z87891 Personal history of nicotine dependence: Secondary | ICD-10-CM | POA: Insufficient documentation

## 2014-07-26 DIAGNOSIS — O209 Hemorrhage in early pregnancy, unspecified: Secondary | ICD-10-CM | POA: Insufficient documentation

## 2014-07-26 DIAGNOSIS — O4690 Antepartum hemorrhage, unspecified, unspecified trimester: Secondary | ICD-10-CM

## 2014-07-26 NOTE — MAU Note (Signed)
Patient states she had bright red bleeding in her panties and on tissue with wiping. Not wearing a pad at this time. Some off and on cramping.

## 2014-07-26 NOTE — MAU Provider Note (Signed)
History     CSN: [id number]  Arrival date and time: 07/26/14 1618   None     Chief Complaint  Patient presents with  . Vaginal Bleeding   HPI  Pt is [id number] @ [redacted]w[redacted]d pregnant initially seen on 07/11/2014 for evaluation of pain.  Pt had an HCG of 523 with f/u HCG on 07/13/2014 of 1490.  Pt had ultrasound on 8/10 that showed no IUGS at the time and dominant cystic area on the right. Pt had neg wet prep and GC/chlamydia when she was seen.   Pt had bleeding today when she wiped. Pt had IC last night and onset of bleeding this afternoon at 4 pm.  Pt has an appointment for New OB at [facility] RN note: Patient states she had bright red bleeding in her panties and on tissue with wiping. Not wearing a pad at this time. Some off and on cramping  Past Medical History  Diagnosis Date  . Bronchitis   . Anemia     Past Surgical History  Procedure Laterality Date  . No past surgeries      Family History  Problem Relation Age of Onset  . Stroke Mother   . Hypertension Mother   . Migraines Mother   . Seizures Mother   . Diabetes Father   . Hyperlipidemia Father   . Hypertension Father   . Rashes / Skin problems Brother   . Seizures Brother   . Rheum arthritis Neg Hx   . Osteoarthritis Neg Hx   . Asthma Neg Hx   . Cancer Neg Hx   . Heart failure Neg Hx   . Thyroid disease Neg Hx     History  Substance Use Topics  . Smoking status: Former Smoker -- 0.50 packs/day    Quit date: 12/03/2007  . Smokeless tobacco: Not on file  . Alcohol Use: 4.2 oz/week    7 Glasses of wine per week     Comment: stopped with +UPT    Allergies:  Allergies  Allergen Reactions  . Citrus Other (See Comments)    Gets blisters around her mouth.    Prescriptions prior to admission  Medication Sig Dispense Refill  . triamcinolone ointment (KENALOG) 0.1 % Apply 1 application topically as needed (rash and itching).        Review of Systems  Constitutional: Negative for fever and chills.   Gastrointestinal: Positive for nausea. Negative for vomiting, abdominal pain, diarrhea and constipation.  Genitourinary: Negative for dysuria and urgency.   Physical Exam   Blood pressure 127/76, pulse 79, temperature 98.4 F (36.9 C), temperature source Oral, resp. rate 16, height  (1.6 m), weight 219 lb 12.8 oz (99.701 kg), last menstrual period 06/06/2014, SpO2 100.00%.  Physical Exam  Nursing note and vitals reviewed. Constitutional: She is oriented to person, place, and time. She appears well-developed and well-nourished. No distress.  HENT:  Head: Normocephalic.  Neck: Normal range of motion. Neck supple.  Cardiovascular: Normal rate.   Respiratory: Effort normal.  GI: Soft. She exhibits no distension. There is no tenderness. There is no rebound.  Genitourinary:  Small amount of bright red blood in vault; cervix closed, NT ; uterus 7 weeks size, nontender  Musculoskeletal: Normal range of motion.  Neurological: She is alert and oriented to person, place, and time.  Skin: Skin is warm and dry.  Psychiatric: She has a normal mood and affect.    MAU Course  Procedures US Ob Transvaginal  07/26/2014  CLINICAL DATA:  Vaginal bleeding, positive pregnancy test  EXAM: TRANSVAGINAL OB ULTRASOUND  TECHNIQUE: Transvaginal ultrasound was performed for complete evaluation of the gestation as well as the maternal uterus, adnexal regions, and pelvic cul-de-sac.  COMPARISON:  None.  FINDINGS: Intrauterine gestational sac: Visualized/normal in shape.  Yolk sac:  Visualized  Embryo:  Visualized  Cardiac Activity: Visualized  Heart Rate: 120 bpm  CRL:   6  mm   6 w 4d                  [place] EDC: 03/17/15  Maternal uterus/adnexae: Right ovarian simple appearing cyst measures 6.6 x 6.6 x 5.7 cm. Left ovary is normal. No free fluid.  IMPRESSION: Intrauterine gestational sac, yolk sac, and fetal pole with cardiac activity, 6 weeks 4 days by today's exam. No acute abnormality.   Electronically Signed    By: Christiana Pellant M.D.   On: 07/26/2014 17:09     Assessment and Plan  Single living IUGS with YS and fetal pole with cardiac activity [redacted]w[redacted]d Bleeding in pregnancy F/u with OB provider  LINEBERRY,SUSAN 07/26/2014, 5:18 PM

## 2014-07-28 NOTE — MAU Provider Note (Signed)
Attestation of Attending Supervision of Advanced Practitioner (PA/CNM/NP): Evaluation and management procedures were performed by the Advanced Practitioner under my supervision and collaboration.  I have reviewed the Advanced Practitioner's note and chart, and I agree with the management and plan.  Dajanay Northrup, MD, FACOG Attending Obstetrician & Gynecologist Faculty Practice, [facility]   

## 2014-10-03 ENCOUNTER — Encounter (HOSPITAL_COMMUNITY): Payer: Self-pay | Admitting: *Deleted

## 2014-10-18 LAB — OB RESULTS CONSOLE ABO/RH: RH TYPE: POSITIVE

## 2014-10-18 LAB — OB RESULTS CONSOLE ANTIBODY SCREEN: ANTIBODY SCREEN: NEGATIVE

## 2014-10-18 LAB — OB RESULTS CONSOLE HEPATITIS B SURFACE ANTIGEN: HEP B S AG: NEGATIVE

## 2014-10-18 LAB — OB RESULTS CONSOLE GC/CHLAMYDIA
Chlamydia: NEGATIVE
Gonorrhea: NEGATIVE

## 2014-10-18 LAB — OB RESULTS CONSOLE RPR: RPR: NONREACTIVE

## 2014-10-18 LAB — OB RESULTS CONSOLE RUBELLA ANTIBODY, IGM: RUBELLA: IMMUNE

## 2014-10-18 LAB — OB RESULTS CONSOLE HIV ANTIBODY (ROUTINE TESTING): HIV: NONREACTIVE

## 2014-12-02 NOTE — L&D Delivery Note (Signed)
Operative Delivery Note Patient pushed for less than five minutes and at 11:41 PM a viable and healthy female was delivered via Vaginal, Spontaneous Delivery.  Presentation: vertex; Position: Right,, Occiput,, Anterior;  With the delivery of the head the anterior shoulder did not deliver spontaneously with gentle downward traction and a shoulder dystocia was identified. There was also a notable tight nuchal cord.  The cord was double clamped on the neck and cut, the shoulder was delivered with McRoberts maneuver. The rest of the body was easily delivered.  Peds were called to delivery.  Cord gases were obtained.  Infant noted to have vigorous cry and moving all four extremities well.   Delivery of the head:   , McRoberts Second maneuver: not needed        APGAR: pending per PEDS Placenta status: Intact, Spontaneous.   Cord: 3 vessels with the following complications: None.  Cord pH: pending Anesthesia: Epidural  Episiotomy: None Lacerations:    Est. Blood Loss (mL): 300  Mom to postpartum.  Baby to Couplet care / Skin to Skin.  Essie HartINN, Elexis Pollak STACIA 03/08/2015, 12:01 AM

## 2015-02-17 LAB — OB RESULTS CONSOLE GBS: GBS: POSITIVE

## 2015-02-28 ENCOUNTER — Encounter (HOSPITAL_COMMUNITY): Payer: Self-pay | Admitting: *Deleted

## 2015-02-28 ENCOUNTER — Telehealth (HOSPITAL_COMMUNITY): Payer: Self-pay | Admitting: *Deleted

## 2015-02-28 NOTE — Telephone Encounter (Signed)
Preadmission screen  

## 2015-03-07 ENCOUNTER — Encounter (HOSPITAL_COMMUNITY): Payer: Self-pay

## 2015-03-07 ENCOUNTER — Inpatient Hospital Stay (HOSPITAL_COMMUNITY): Payer: Medicaid Other | Admitting: Anesthesiology

## 2015-03-07 ENCOUNTER — Inpatient Hospital Stay (HOSPITAL_COMMUNITY)
Admission: RE | Admit: 2015-03-07 | Discharge: 2015-03-09 | DRG: 775 | Disposition: A | Discharge status: Home / Self Care | Payer: Medicaid Other | Source: Ambulatory Visit | Attending: Obstetrics & Gynecology | Admitting: Obstetrics & Gynecology

## 2015-03-07 DIAGNOSIS — Z87891 Personal history of nicotine dependence: Secondary | ICD-10-CM

## 2015-03-07 DIAGNOSIS — Z823 Family history of stroke: Secondary | ICD-10-CM

## 2015-03-07 DIAGNOSIS — Z3A39 39 weeks gestation of pregnancy: Secondary | ICD-10-CM | POA: Diagnosis present

## 2015-03-07 DIAGNOSIS — IMO0002 Reserved for concepts with insufficient information to code with codable children: Secondary | ICD-10-CM | POA: Diagnosis present

## 2015-03-07 DIAGNOSIS — Z8249 Family history of ischemic heart disease and other diseases of the circulatory system: Secondary | ICD-10-CM | POA: Diagnosis not present

## 2015-03-07 DIAGNOSIS — O99824 Streptococcus B carrier state complicating childbirth: Secondary | ICD-10-CM | POA: Diagnosis present

## 2015-03-07 DIAGNOSIS — Z833 Family history of diabetes mellitus: Secondary | ICD-10-CM

## 2015-03-07 DIAGNOSIS — O99214 Obesity complicating childbirth: Secondary | ICD-10-CM | POA: Diagnosis present

## 2015-03-07 DIAGNOSIS — O9989 Other specified diseases and conditions complicating pregnancy, childbirth and the puerperium: Secondary | ICD-10-CM | POA: Diagnosis present

## 2015-03-07 DIAGNOSIS — Z6841 Body Mass Index (BMI) 40.0 and over, adult: Secondary | ICD-10-CM | POA: Diagnosis not present

## 2015-03-07 DIAGNOSIS — O3663X0 Maternal care for excessive fetal growth, third trimester, not applicable or unspecified: Principal | ICD-10-CM | POA: Diagnosis present

## 2015-03-07 DIAGNOSIS — Z3490 Encounter for supervision of normal pregnancy, unspecified, unspecified trimester: Secondary | ICD-10-CM

## 2015-03-07 LAB — CBC
HCT: 34.7 % — ABNORMAL LOW (ref 36.0–46.0)
Hemoglobin: 10.9 g/dL — ABNORMAL LOW (ref 12.0–15.0)
MCH: 24.5 pg — AB (ref 26.0–34.0)
MCHC: 31.4 g/dL (ref 30.0–36.0)
MCV: 78 fL (ref 78.0–100.0)
Platelets: 236 10*3/uL (ref 150–400)
RBC: 4.45 MIL/uL (ref 3.87–5.11)
RDW: 15.8 % — ABNORMAL HIGH (ref 11.5–15.5)
WBC: 8.8 10*3/uL (ref 4.0–10.5)

## 2015-03-07 LAB — TYPE AND SCREEN
ABO/RH(D): O POS
ANTIBODY SCREEN: NEGATIVE

## 2015-03-07 LAB — RPR: RPR Ser Ql: NONREACTIVE

## 2015-03-07 MED ORDER — PHENYLEPHRINE 40 MCG/ML (10ML) SYRINGE FOR IV PUSH (FOR BLOOD PRESSURE SUPPORT)
80.0000 ug | PREFILLED_SYRINGE | INTRAVENOUS | Status: DC | PRN
  Administered 2015-03-07 (×2): 80 ug via INTRAVENOUS

## 2015-03-07 MED ORDER — PHENYLEPHRINE 40 MCG/ML (10ML) SYRINGE FOR IV PUSH (FOR BLOOD PRESSURE SUPPORT)
80.0000 ug | PREFILLED_SYRINGE | INTRAVENOUS | Status: DC | PRN
  Filled 2015-03-07: qty 20

## 2015-03-07 MED ORDER — LACTATED RINGERS IV SOLN
500.0000 mL | Freq: Once | INTRAVENOUS | Status: AC
  Administered 2015-03-07: 500 mL via INTRAVENOUS

## 2015-03-07 MED ORDER — EPHEDRINE 5 MG/ML INJ
10.0000 mg | INTRAVENOUS | Status: DC | PRN
Start: 2015-03-07 — End: 2015-03-07

## 2015-03-07 MED ORDER — LIDOCAINE HCL (PF) 1 % IJ SOLN
INTRAMUSCULAR | Status: DC | PRN
  Administered 2015-03-07 (×2): 9 mL

## 2015-03-07 MED ORDER — CITRIC ACID-SODIUM CITRATE 334-500 MG/5ML PO SOLN: 30.0000 mL | ORAL | Status: DC | PRN

## 2015-03-07 MED ORDER — FENTANYL 2.5 MCG/ML BUPIVACAINE 1/10 % EPIDURAL INFUSION (WH - ANES)
INTRAMUSCULAR | Status: DC | PRN
  Administered 2015-03-07: 14 mL/h via EPIDURAL

## 2015-03-07 MED ORDER — LACTATED RINGERS IV SOLN
INTRAVENOUS | Status: DC
  Administered 2015-03-07 (×3): via INTRAVENOUS

## 2015-03-07 MED ORDER — DIPHENHYDRAMINE HCL 50 MG/ML IJ SOLN: 12.5000 mg | INTRAMUSCULAR | Status: DC | PRN

## 2015-03-07 MED ORDER — TERBUTALINE SULFATE 1 MG/ML IJ SOLN: 0.2500 mg | Freq: Once | INTRAMUSCULAR | Status: DC | PRN

## 2015-03-07 MED ORDER — EPHEDRINE 5 MG/ML INJ: 10.0000 mg | INTRAVENOUS | Status: DC | PRN

## 2015-03-07 MED ORDER — PENICILLIN G POTASSIUM 5000000 UNITS IJ SOLR
5.0000 10*6.[IU] | Freq: Once | INTRAVENOUS | Status: AC
  Administered 2015-03-07: 5 10*6.[IU] via INTRAVENOUS
  Filled 2015-03-07: qty 5

## 2015-03-07 MED ORDER — OXYTOCIN 40 UNITS IN LACTATED RINGERS INFUSION - SIMPLE MED
62.5000 mL/h | INTRAVENOUS | Status: DC
  Administered 2015-03-08: 62.5 mL/h via INTRAVENOUS

## 2015-03-07 MED ORDER — OXYCODONE-ACETAMINOPHEN 5-325 MG PO TABS: 2.0000 | ORAL_TABLET | ORAL | Status: DC | PRN

## 2015-03-07 MED ORDER — OXYTOCIN BOLUS FROM INFUSION
500.0000 mL | INTRAVENOUS | Status: DC
  Administered 2015-03-07: 500 mL via INTRAVENOUS

## 2015-03-07 MED ORDER — FLEET ENEMA 7-19 GM/118ML RE ENEM: 1.0000 | ENEMA | RECTAL | Status: DC | PRN

## 2015-03-07 MED ORDER — ACETAMINOPHEN 325 MG PO TABS: 650.0000 mg | ORAL_TABLET | ORAL | Status: DC | PRN

## 2015-03-07 MED ORDER — FENTANYL 2.5 MCG/ML BUPIVACAINE 1/10 % EPIDURAL INFUSION (WH - ANES)
14.0000 mL/h | INTRAMUSCULAR | Status: DC | PRN
  Administered 2015-03-07: 14 mL/h via EPIDURAL
  Filled 2015-03-07: qty 125

## 2015-03-07 MED ORDER — PENICILLIN G POTASSIUM 5000000 UNITS IJ SOLR
2.5000 10*6.[IU] | INTRAVENOUS | Status: DC
  Administered 2015-03-07 (×2): 2.5 10*6.[IU] via INTRAVENOUS
  Filled 2015-03-07 (×10): qty 2.5

## 2015-03-07 MED ORDER — ONDANSETRON HCL 4 MG/2ML IJ SOLN: 4.0000 mg | Freq: Four times a day (QID) | INTRAMUSCULAR | Status: DC | PRN

## 2015-03-07 MED ORDER — OXYTOCIN 40 UNITS IN LACTATED RINGERS INFUSION - SIMPLE MED
1.0000 m[IU]/min | INTRAVENOUS | Status: DC
  Administered 2015-03-07: 2 m[IU]/min via INTRAVENOUS
  Filled 2015-03-07: qty 1000

## 2015-03-07 MED ORDER — LACTATED RINGERS IV SOLN: 500.0000 mL | INTRAVENOUS | Status: DC | PRN

## 2015-03-07 MED ORDER — MISOPROSTOL 25 MCG QUARTER TABLET
25.0000 ug | ORAL_TABLET | ORAL | Status: DC | PRN
  Administered 2015-03-07: 25 ug via VAGINAL
  Filled 2015-03-07: qty 0.25

## 2015-03-07 MED ORDER — LIDOCAINE HCL (PF) 1 % IJ SOLN
30.0000 mL | INTRAMUSCULAR | Status: DC | PRN
  Filled 2015-03-07: qty 30

## 2015-03-07 MED ORDER — OXYCODONE-ACETAMINOPHEN 5-325 MG PO TABS: 1.0000 | ORAL_TABLET | ORAL | Status: DC | PRN

## 2015-03-07 NOTE — Progress Notes (Signed)
Diana GingerLakecia Spruce is a 29 y.o. [id number] at 3542w1d by LMP admitted for induction of labor due to Elective at term.  Subjective: Patient feeling more pain  Objective: BP 105/69 mmHg  Pulse 92  Temp(Src) 98.7 F (37.1 C) (Oral)  Resp 18  Ht 5\' 3"  (1.6 m)  Wt 105.688 kg (233 lb)  BMI 41.28 kg/m2  LMP 06/06/2014      FHT:  FHR: 145 bpm, variability: moderate,  accelerations:  Present,  decelerations:  Absent UC:   regular, every 3 minutes SVE:   Dilation: 3.5 Effacement (%): 50 Station: -2 Exam AV:WUJWby:Anuja Manka MD  Labs: Lab Results  Component Value Date   WBC 8.8 03/07/2015   HGB 10.9* 03/07/2015   HCT 34.7* 03/07/2015   MCV 78.0 03/07/2015   PLT 236 03/07/2015    Assessment / Plan: Induction of labor due to elective at term,  progressing well on pitocin  Labor: Progressing on Pitocin, will continue to increase then AROM Preeclampsia:  no signs or symptoms of toxicity Fetal Wellbeing:  Category I Pain Control:  Epidural - requesting now I/D:  n/a Anticipated MOD:  NSVD  Tahir Blank STACIA 03/07/2015, 8:36 PM

## 2015-03-07 NOTE — Anesthesia Procedure Notes (Signed)
Epidural Patient location during procedure: OB Start time: 03/07/2015 9:12 PM End time: 03/07/2015 9:16 PM  Staffing Anesthesiologist: Leilani AbleHATCHETT, Pawel Soules Performed by: anesthesiologist   Preanesthetic Checklist Completed: patient identified, surgical consent, pre-op evaluation, timeout performed, IV checked, risks and benefits discussed and monitors and equipment checked  Epidural Patient position: sitting Prep: site prepped and draped and DuraPrep Patient monitoring: continuous pulse ox and blood pressure Approach: midline Location: L3-L4 Injection technique: LOR air  Needle:  Needle type: Tuohy  Needle gauge: 17 G Needle length: 9 cm and 9 Needle insertion depth: 7 cm Catheter type: closed end flexible Catheter size: 19 Gauge Catheter at skin depth: 13 cm Test dose: negative and Other  Assessment Sensory level: T9 Events: blood not aspirated, injection not painful, no injection resistance, negative IV test and no paresthesia  Additional Notes Reason for block:procedure for pain

## 2015-03-07 NOTE — Anesthesia Preprocedure Evaluation (Signed)
Anesthesia Evaluation  Patient identified by MRN, date of birth, ID band Patient awake    Reviewed: Allergy & Precautions, H&P , NPO status , Patient's Chart, lab work & pertinent test results  Airway Mallampati: II  TM Distance: >3 FB Neck ROM: full    Dental no notable dental hx.    Pulmonary former smoker,    Pulmonary exam normal       Cardiovascular negative cardio ROS      Neuro/Psych negative neurological ROS  negative psych ROS   GI/Hepatic negative GI ROS, Neg liver ROS,   Endo/Other  Morbid obesity  Renal/GU negative Renal ROS     Musculoskeletal   Abdominal (+) + obese,   Peds  Hematology   Anesthesia Other Findings   Reproductive/Obstetrics (+) Pregnancy                             Anesthesia Physical Anesthesia Plan  ASA: III  Anesthesia Plan: Epidural   Post-op Pain Management:    Induction:   Airway Management Planned:   Additional Equipment:   Intra-op Plan:   Post-operative Plan:   Informed Consent: I have reviewed the patients History and Physical, chart, labs and discussed the procedure including the risks, benefits and alternatives for the proposed anesthesia with the patient or authorized representative who has indicated his/her understanding and acceptance.     Plan Discussed with:   Anesthesia Plan Comments:         Anesthesia Quick Evaluation

## 2015-03-07 NOTE — H&P (Signed)
Diana Hudson is a 29 y.o. female presenting for elective IOL for h/o macrosomia shoulder dystocia, and fetus LGA. Patient denies Ctx, no vaginal bleeding, no leaking of fluid.   Maternal Medical History:  Reason for admission: Nausea. Elective IOL for h/o macrosomia, and shoulder dystocia  Contractions: Onset was 3-5 hours ago.   Frequency: irregular.   Duration is approximately 60 seconds.   Perceived severity is mild.    Fetal activity: Perceived fetal activity is normal.   Last perceived fetal movement was within the past 12 hours.    Prenatal complications: no prenatal complications Prenatal Complications - Diabetes: none.    OB History    Gravida Para Term Preterm AB TAB SAB Ectopic Multiple Living   Past Medical History  Diagnosis Date  . Bronchitis   . Anemia   . Hx of varicella    Past Surgical History  Procedure Laterality Date  . No past surgeries     Family History: family history includes Diabetes in her father; Hyperlipidemia in her father; Hypertension in her father and mother; Migraines in her mother; Rashes / Skin problems in her brother; Seizures in her brother and mother; Stroke in her mother; Thrombophlebitis in her mother; Vision loss in her father. There is no history of Rheum arthritis, Osteoarthritis, Asthma, Cancer, Heart failure, or Thyroid disease. Social History:  reports that she quit smoking about 7 years ago. She has never used smokeless tobacco. She reports that she drinks about 4.2 oz of alcohol per week. She reports that she does not use illicit drugs.   Prenatal Transfer Tool  Maternal Diabetes: No Genetic Screening: Normal Maternal Ultrasounds/Referrals: Normal Fetal Ultrasounds or other Referrals:  Other: Large for GA Maternal Substance Abuse:  No Significant Maternal Medications:  None Significant Maternal Lab Results:  Lab values include: Group B Strep positive Other Comments:  None  Review of Systems   Constitutional: Negative for fever and chills.  HENT: Negative for hearing loss.   Eyes: Negative for blurred vision and double vision.  Respiratory: Negative for cough.   Cardiovascular: Negative for chest pain and palpitations.  Gastrointestinal: Negative for heartburn and nausea.  Genitourinary: Negative for dysuria and urgency.  Musculoskeletal: Negative for myalgias.  Skin: Negative for itching and rash.  Neurological: Negative for dizziness, tingling and headaches.  Endo/Heme/Allergies: Does not bruise/bleed easily.  Psychiatric/Behavioral: Negative for depression and suicidal ideas.  All other systems reviewed and are negative.     Blood pressure 126/92, pulse 95, temperature 98 F (36.7 C), temperature source Oral, resp. rate 18, height  (1.6 m), weight 105.688 kg (233 lb), last menstrual period 06/06/2014. Maternal Exam:  Uterine Assessment: Contraction strength is mild.  Contraction duration is 45 seconds. Contraction frequency is rare.   Abdomen: Patient reports no abdominal tenderness. Fundal height is 40 cm.   Estimated fetal weight is 3900 grams.   Fetal presentation: vertex  Introitus: Normal vulva. Normal vagina.  Ferning test: not done.  Nitrazine test: not done. Amniotic fluid character: not assessed.  Pelvis: adequate for delivery.   Cervix: Cervix evaluated by digital exam.   2/40/-2  Fetal Exam Fetal Monitor Review: Mode: hand-held doppler probe.   Baseline rate: 140.  Variability: moderate (6-25 bpm).   Pattern: accelerations present and no decelerations.    Fetal State Assessment: Category I - tracings are normal.     Physical Exam  Nursing note and vitals reviewed. Constitutional: She  is oriented to person, place, and time. She appears well-developed and well-nourished.  HENT:  Head: Normocephalic and atraumatic.  Eyes: Conjunctivae are normal. Pupils are equal, round, and reactive to light.  Cardiovascular: Normal rate and regular  rhythm.   Respiratory: Effort normal and breath sounds normal.  GI: Soft. Bowel sounds are normal.  Genitourinary: Vagina normal and uterus normal.  Musculoskeletal: Normal range of motion.  Neurological: She is alert and oriented to person, place, and time. She has normal reflexes.  Skin: Skin is warm.  Psychiatric: She has a normal mood and affect. Her behavior is normal. Judgment and thought content normal.    Prenatal labs: ABO, Rh: O/Positive/-- (11/17 0000) Antibody: Negative (11/17 0000) Rubella: Immune (11/17 0000) RPR: Nonreactive (11/17 0000)  HBsAg: Negative (11/17 0000)  HIV: Non-reactive (11/17 0000)  GBS: Positive (03/18 0000)   Assessment/Plan: 29 yo G5P2 at 39 weeks 1 day for elective IOL for possible macrosomia. Patient with h/o macrosomia and Severe shoulder dystocia last pregnancy Misoprostol x 1 for cervical ripening then pitocin for augmentation.  Epidural on demand  Active management of labor   Essie HartINN, Fritz Cauthon STACIA 03/07/2015, 8:07 AM

## 2015-03-08 ENCOUNTER — Encounter (HOSPITAL_COMMUNITY): Payer: Self-pay

## 2015-03-08 LAB — CCBB MATERNAL DONOR DRAW

## 2015-03-08 LAB — CBC
HEMATOCRIT: 32.1 % — AB (ref 36.0–46.0)
Hemoglobin: 9.9 g/dL — ABNORMAL LOW (ref 12.0–15.0)
MCH: 24.1 pg — ABNORMAL LOW (ref 26.0–34.0)
MCHC: 30.8 g/dL (ref 30.0–36.0)
MCV: 78.3 fL (ref 78.0–100.0)
Platelets: 214 10*3/uL (ref 150–400)
RBC: 4.1 MIL/uL (ref 3.87–5.11)
RDW: 15.6 % — ABNORMAL HIGH (ref 11.5–15.5)
WBC: 12.3 10*3/uL — AB (ref 4.0–10.5)

## 2015-03-08 MED ORDER — ONDANSETRON HCL 4 MG PO TABS: 4.0000 mg | ORAL_TABLET | ORAL | Status: DC | PRN

## 2015-03-08 MED ORDER — SENNOSIDES-DOCUSATE SODIUM 8.6-50 MG PO TABS
2.0000 | ORAL_TABLET | ORAL | Status: DC
  Administered 2015-03-09: 2 via ORAL
  Filled 2015-03-08 (×2): qty 2

## 2015-03-08 MED ORDER — LANOLIN HYDROUS EX OINT
TOPICAL_OINTMENT | CUTANEOUS | Status: DC | PRN
Start: 2015-03-08 — End: 2015-03-09

## 2015-03-08 MED ORDER — SIMETHICONE 80 MG PO CHEW: 80.0000 mg | CHEWABLE_TABLET | ORAL | Status: DC | PRN

## 2015-03-08 MED ORDER — OXYCODONE-ACETAMINOPHEN 5-325 MG PO TABS: 2.0000 | ORAL_TABLET | ORAL | Status: DC | PRN

## 2015-03-08 MED ORDER — BENZOCAINE-MENTHOL 20-0.5 % EX AERO: 1.0000 "application " | INHALATION_SPRAY | CUTANEOUS | Status: DC | PRN

## 2015-03-08 MED ORDER — ONDANSETRON HCL 4 MG/2ML IJ SOLN: 4.0000 mg | INTRAMUSCULAR | Status: DC | PRN

## 2015-03-08 MED ORDER — PRENATAL MULTIVITAMIN CH
1.0000 | ORAL_TABLET | Freq: Every day | ORAL | Status: DC
  Administered 2015-03-08 – 2015-03-09 (×2): 1 via ORAL
  Filled 2015-03-08 (×2): qty 1

## 2015-03-08 MED ORDER — OXYTOCIN 40 UNITS IN LACTATED RINGERS INFUSION - SIMPLE MED: 62.5000 mL/h | INTRAVENOUS | Status: DC | PRN

## 2015-03-08 MED ORDER — TETANUS-DIPHTH-ACELL PERTUSSIS 5-2.5-18.5 LF-MCG/0.5 IM SUSP: 0.5000 mL | Freq: Once | INTRAMUSCULAR | Status: DC

## 2015-03-08 MED ORDER — ZOLPIDEM TARTRATE 5 MG PO TABS: 5.0000 mg | ORAL_TABLET | Freq: Every evening | ORAL | Status: DC | PRN

## 2015-03-08 MED ORDER — OXYCODONE-ACETAMINOPHEN 5-325 MG PO TABS: 1.0000 | ORAL_TABLET | ORAL | Status: DC | PRN

## 2015-03-08 MED ORDER — IBUPROFEN 600 MG PO TABS
600.0000 mg | ORAL_TABLET | Freq: Four times a day (QID) | ORAL | Status: DC
  Administered 2015-03-08 – 2015-03-09 (×7): 600 mg via ORAL
  Filled 2015-03-08 (×7): qty 1

## 2015-03-08 MED ORDER — DIPHENHYDRAMINE HCL 25 MG PO CAPS: 25.0000 mg | ORAL_CAPSULE | Freq: Four times a day (QID) | ORAL | Status: DC | PRN

## 2015-03-08 MED ORDER — DIBUCAINE 1 % RE OINT: 1.0000 "application " | TOPICAL_OINTMENT | RECTAL | Status: DC | PRN

## 2015-03-08 MED ORDER — ACETAMINOPHEN 325 MG PO TABS: 650.0000 mg | ORAL_TABLET | ORAL | Status: DC | PRN

## 2015-03-08 MED ORDER — WITCH HAZEL-GLYCERIN EX PADS: 1.0000 "application " | MEDICATED_PAD | CUTANEOUS | Status: DC | PRN

## 2015-03-08 NOTE — Progress Notes (Signed)
UR chart review completed.  

## 2015-03-08 NOTE — Anesthesia Postprocedure Evaluation (Signed)
  Anesthesia Post-op Note  Patient: Diana Hudson  Procedure(s) Performed: * No procedures listed *  Patient Location: Mother/Baby  Anesthesia Type:Epidural  Level of Consciousness: awake, alert  and oriented  Airway and Oxygen Therapy: Patient Spontanous Breathing  Post-op Pain: none  Post-op Assessment: Post-op Vital signs reviewed and Patient's Cardiovascular Status Stable  Post-op Vital Signs: Reviewed and stable  Last Vitals:  Filed Vitals:   03/08/15 0640  BP: 126/70  Pulse: 77  Temp: 36.8 C  Resp: 16    Complications: No apparent anesthesia complications

## 2015-03-08 NOTE — Progress Notes (Signed)
PPD# 1 Pt without c/o. VSSAF IMP/stable Plan/ routine care.

## 2015-03-08 NOTE — Lactation Note (Signed)
This note was copied from the chart of Boy Bethanne GingerLakecia Suber. Lactation Consultation Note: Experienced BF mom. Has baby latched to breast when I went into room. Mom reports he has been latching well. Going off to sleep. Mom complaining of cramping while nursing. Encouragement given. No questions at present. BF brochure given. Reviewed BFSG and OP appointments as resources for support after DC. To call for assist prn Patient Name: Boy Bethanne GingerLakecia Boies JYNWG'NToday's Date: 03/08/2015 Reason for consult: Initial assessment   Maternal Data Formula Feeding for Exclusion: No Does the patient have breastfeeding experience prior to this delivery?: Yes  Feeding Feeding Type: Breast Fed Length of feed: 25 min  LATCH Score/Interventions Latch: Grasps breast easily, tongue down, lips flanged, rhythmical sucking.  Audible Swallowing: A few with stimulation  Type of Nipple: Everted at rest and after stimulation  Comfort (Breast/Nipple): Soft / non-tender     Hold (Positioning): No assistance needed to correctly position infant at breast.  LATCH Score: 9  Lactation Tools Discussed/Used     Consult Status Consult Status: PRN    Pamelia HoitWeeks, Dontea Corlew D 03/08/2015, 2:21 PM

## 2015-03-09 MED ORDER — DOCUSATE SODIUM 100 MG PO CAPS: 100.0000 mg | ORAL_CAPSULE | Freq: Two times a day (BID) | ORAL | Status: AC

## 2015-03-09 MED ORDER — IBUPROFEN 600 MG PO TABS: 600.0000 mg | ORAL_TABLET | Freq: Four times a day (QID) | ORAL | Status: AC | PRN

## 2015-03-09 MED ORDER — OXYCODONE-ACETAMINOPHEN 5-325 MG PO TABS: 2.0000 | ORAL_TABLET | ORAL | Status: DC | PRN

## 2015-03-09 NOTE — Lactation Note (Signed)
This note was copied from the chart of Diana Bethanne GingerLakecia Toso. Lactation Consultation Note;Follow up visit with this experienced BF mom. She reports baby is feeding a lot. Encouragement given. Reports nipples are sore. Encouraged to keep baby close to the breast- she is letting baby slide to tip of nipple. Comfort gels given with instructions No questions at present TO call prn    Patient Name: Diana Hudson FAOZH'YToday's Date: 03/09/2015 Reason for consult: Follow-up assessment   Maternal Data Formula Feeding for Exclusion: No Has patient been taught Hand Expression?: Yes Does the patient have breastfeeding experience prior to this delivery?: Yes  Feeding Feeding Type: Breast Fed Length of feed: 15 min  LATCH Score/Interventions       Type of Nipple: Everted at rest and after stimulation  Comfort (Breast/Nipple): Filling, red/small blisters or bruises, mild/mod discomfort  Problem noted: Mild/Moderate discomfort Interventions (Mild/moderate discomfort): Comfort gels        Lactation Tools Discussed/Used     Consult Status Consult Status: Complete    Pamelia HoitWeeks, Raygen Linquist D 03/09/2015, 11:12 AM

## 2015-03-15 NOTE — Discharge Summary (Signed)
Obstetric Discharge Summary Reason for Admission: induction of labor Prenatal Procedures: ultrasound Intrapartum Procedures: spontaneous vaginal delivery Postpartum Procedures: none Complications-Operative and Postpartum: none HEMOGLOBIN  Date Value Ref Range Status  03/08/2015 9.9* 12.0 - 15.0 g/dL Final   HCT  Date Value Ref Range Status  03/08/2015 32.1* 36.0 - 46.0 % Final    Physical Exam:  General: alert, cooperative and appears stated age 32Lochia: appropriate Uterine Fundus: firm  Discharge Diagnoses: Term Pregnancy-delivered  Discharge Information: Date: 03/15/2015 Activity: pelvic rest Diet: routine Medications: Ibuprofen, Colace and Percocet Condition: improved Instructions: refer to practice specific booklet Discharge to: home   Newborn Data: Live born female  Birth Weight: 7 lb 14.5 oz (3585 g) APGAR: 7, 9  Home with mother.  Lillianna Sabel H. 03/15/2015, 11:10 PM

## 2015-11-11 IMAGING — CR DG CHEST 2V
2 series · 2 of 2 positions shown · non-contrast
Comparison: None.

CLINICAL DATA: Cough and congestion with flu-like symptoms for 2
days

EXAM:
CHEST  2 VIEW

[view not recorded (1 of 2)]
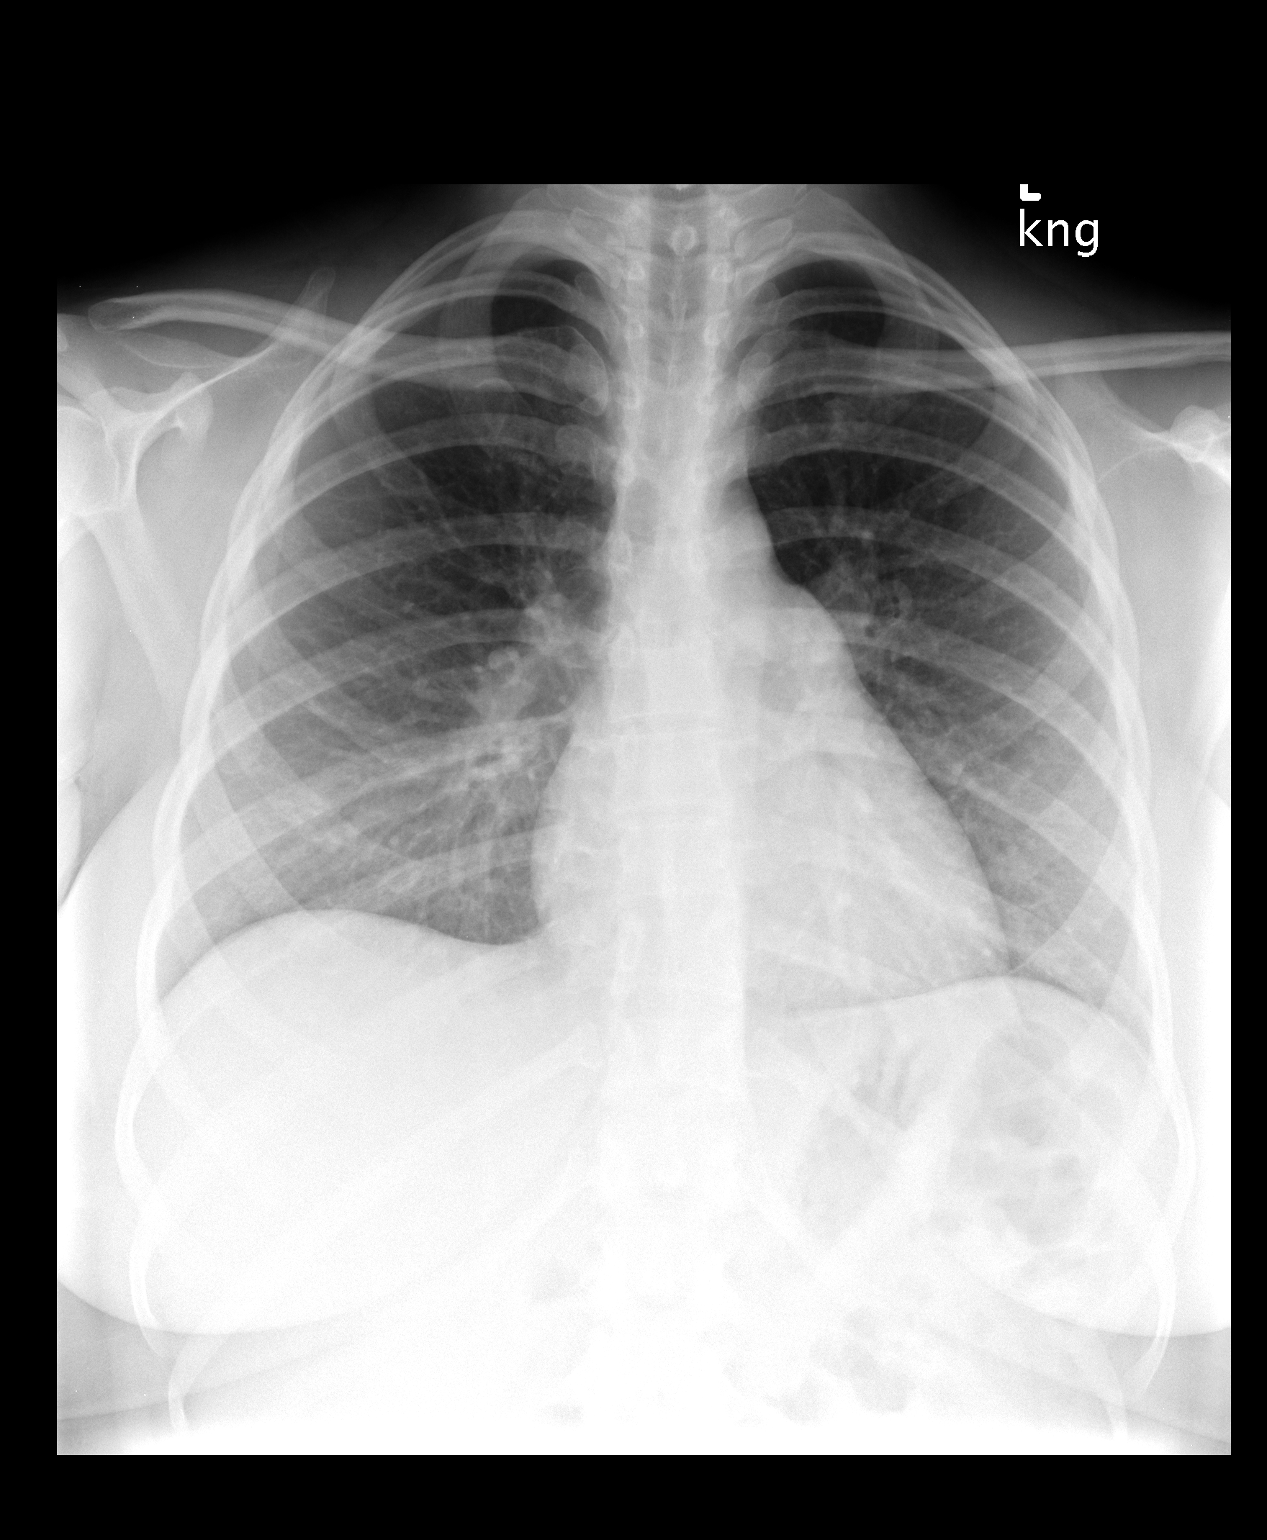

[view not recorded (2 of 2)]
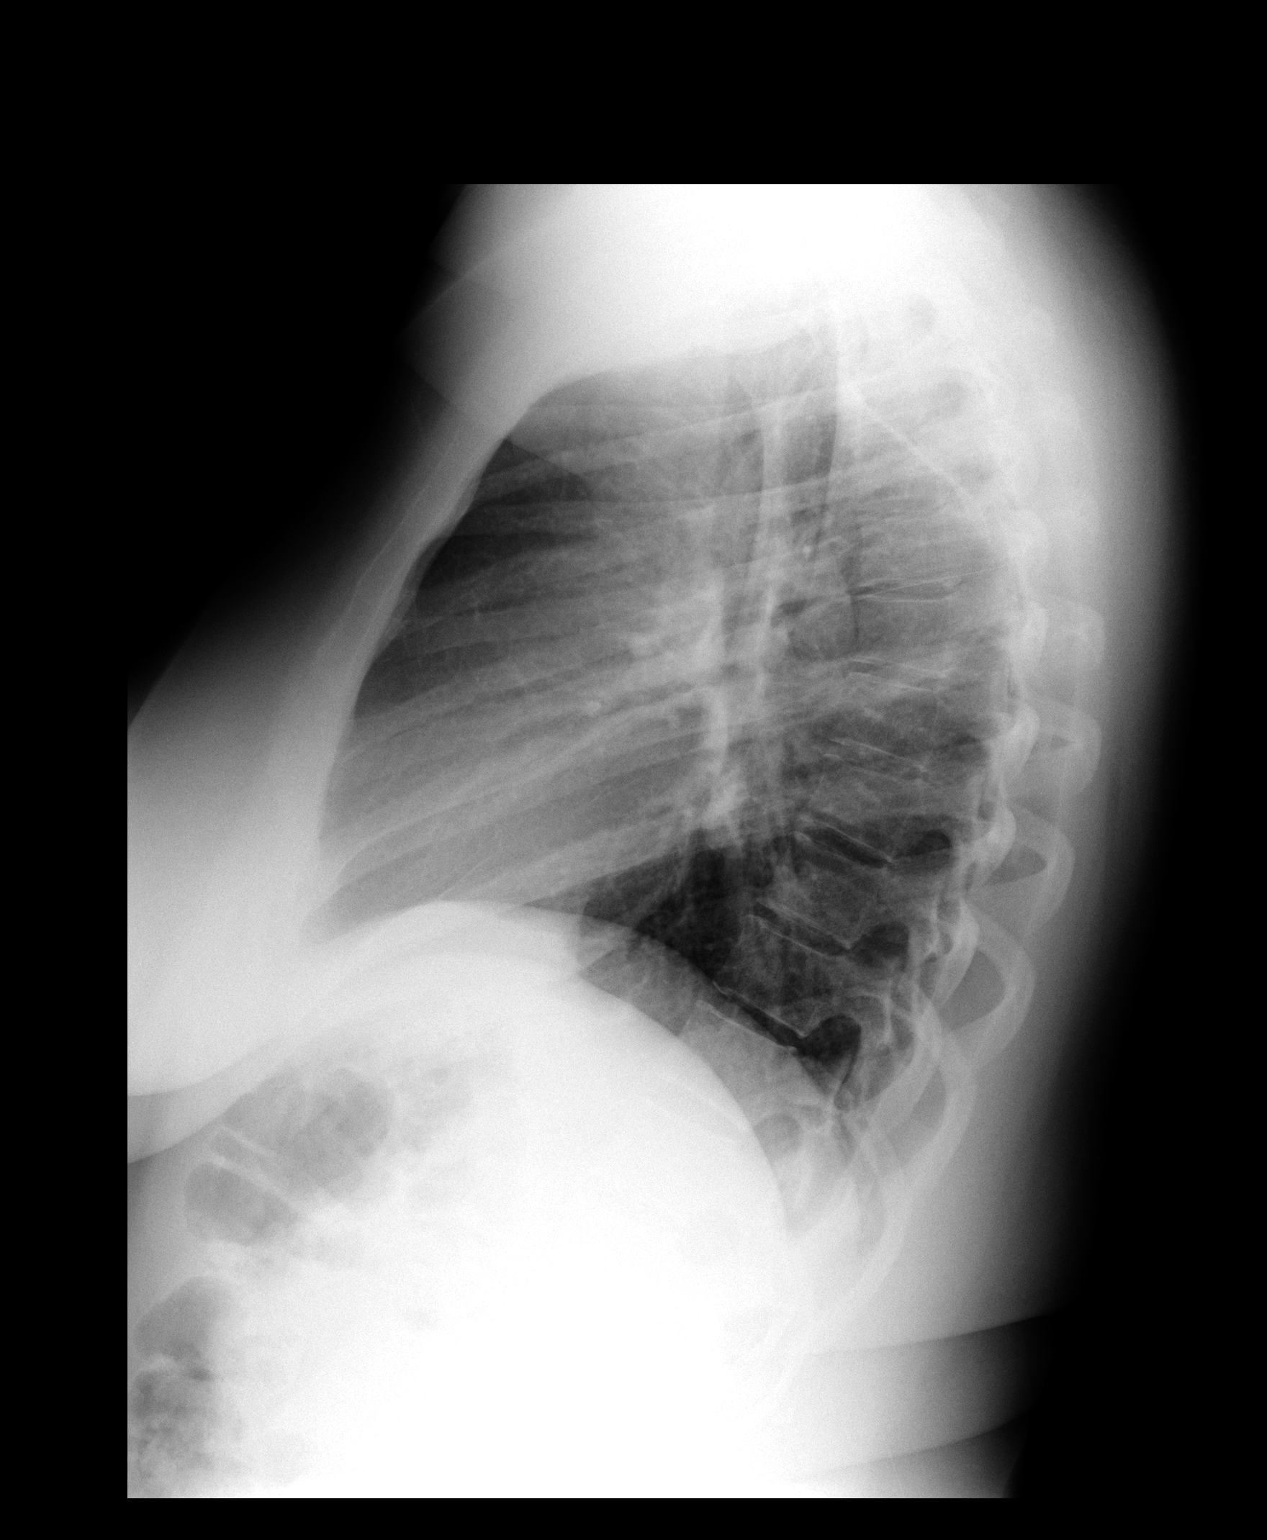

[2 of 2 positions shown; findings below may reference images not displayed]

FINDINGS: The lungs are adequately inflated. There is increased density in the
inferior aspect of the right upper lobe or superior segment of the
right lower lobe consistent with atelectasis or early pneumonia. The
left lung is clear. There is no pleural effusion or pneumothorax.
The mediastinum is normal in width. The cardiac silhouette is normal
in size. The observed portions of the bony thorax appear normal.
IMPRESSION: There is increased density consistent with pneumonia or atelectasis
in the posterior aspect of the right mid lung as described above.
Otherwise the examination is within the limits of normal.

## 2017-02-07 DIAGNOSIS — N898 Other specified noninflammatory disorders of vagina: Secondary | ICD-10-CM | POA: Diagnosis not present

## 2017-02-07 DIAGNOSIS — Z6839 Body mass index (BMI) 39.0-39.9, adult: Secondary | ICD-10-CM | POA: Diagnosis not present

## 2017-02-07 DIAGNOSIS — R102 Pelvic and perineal pain: Secondary | ICD-10-CM | POA: Diagnosis not present

## 2017-03-05 DIAGNOSIS — Z01419 Encounter for gynecological examination (general) (routine) without abnormal findings: Secondary | ICD-10-CM | POA: Diagnosis not present

## 2017-03-05 DIAGNOSIS — Z124 Encounter for screening for malignant neoplasm of cervix: Secondary | ICD-10-CM | POA: Diagnosis not present

## 2017-03-05 DIAGNOSIS — Z6839 Body mass index (BMI) 39.0-39.9, adult: Secondary | ICD-10-CM | POA: Diagnosis not present

## 2018-02-27 DIAGNOSIS — R7309 Other abnormal glucose: Secondary | ICD-10-CM | POA: Diagnosis not present

## 2018-02-27 DIAGNOSIS — E6609 Other obesity due to excess calories: Secondary | ICD-10-CM | POA: Diagnosis not present

## 2018-03-02 DIAGNOSIS — R7309 Other abnormal glucose: Secondary | ICD-10-CM | POA: Diagnosis not present

## 2018-03-02 DIAGNOSIS — E6609 Other obesity due to excess calories: Secondary | ICD-10-CM | POA: Diagnosis not present

## 2018-03-09 DIAGNOSIS — E6609 Other obesity due to excess calories: Secondary | ICD-10-CM | POA: Diagnosis not present

## 2018-03-09 DIAGNOSIS — Z0000 Encounter for general adult medical examination without abnormal findings: Secondary | ICD-10-CM | POA: Diagnosis not present

## 2018-03-09 DIAGNOSIS — D649 Anemia, unspecified: Secondary | ICD-10-CM | POA: Diagnosis not present

## 2018-03-09 DIAGNOSIS — R7309 Other abnormal glucose: Secondary | ICD-10-CM | POA: Diagnosis not present

## 2018-12-30 DIAGNOSIS — Z13220 Encounter for screening for lipoid disorders: Secondary | ICD-10-CM | POA: Diagnosis not present

## 2018-12-30 DIAGNOSIS — R739 Hyperglycemia, unspecified: Secondary | ICD-10-CM | POA: Diagnosis not present

## 2018-12-30 DIAGNOSIS — Z0000 Encounter for general adult medical examination without abnormal findings: Secondary | ICD-10-CM | POA: Diagnosis not present

## 2019-01-11 DIAGNOSIS — F1099 Alcohol use, unspecified with unspecified alcohol-induced disorder: Secondary | ICD-10-CM | POA: Diagnosis not present

## 2019-01-11 DIAGNOSIS — E6609 Other obesity due to excess calories: Secondary | ICD-10-CM | POA: Diagnosis not present

## 2019-01-11 DIAGNOSIS — R739 Hyperglycemia, unspecified: Secondary | ICD-10-CM | POA: Diagnosis not present

## 2019-01-11 DIAGNOSIS — Z0000 Encounter for general adult medical examination without abnormal findings: Secondary | ICD-10-CM | POA: Diagnosis not present

## 2019-08-13 ENCOUNTER — Inpatient Hospital Stay (HOSPITAL_BASED_OUTPATIENT_CLINIC_OR_DEPARTMENT_OTHER)
Admission: EM | Admit: 2019-08-13 | Discharge: 2019-08-13 | Disposition: A | Discharge status: Home / Self Care | Payer: BC Managed Care – PPO | Attending: Obstetrics and Gynecology | Admitting: Obstetrics and Gynecology

## 2019-08-13 ENCOUNTER — Encounter (HOSPITAL_BASED_OUTPATIENT_CLINIC_OR_DEPARTMENT_OTHER): Payer: Self-pay | Admitting: Emergency Medicine

## 2019-08-13 ENCOUNTER — Emergency Department (HOSPITAL_BASED_OUTPATIENT_CLINIC_OR_DEPARTMENT_OTHER): Payer: BC Managed Care – PPO

## 2019-08-13 ENCOUNTER — Inpatient Hospital Stay (HOSPITAL_COMMUNITY): Payer: BC Managed Care – PPO | Admitting: Certified Registered Nurse Anesthetist

## 2019-08-13 ENCOUNTER — Encounter (HOSPITAL_COMMUNITY)
Admission: EM | Disposition: A | Discharge status: Home / Self Care | Payer: Self-pay | Source: Home / Self Care | Attending: Emergency Medicine

## 2019-08-13 ENCOUNTER — Other Ambulatory Visit: Payer: Self-pay

## 2019-08-13 DIAGNOSIS — R102 Pelvic and perineal pain: Secondary | ICD-10-CM

## 2019-08-13 DIAGNOSIS — Z30432 Encounter for removal of intrauterine contraceptive device: Secondary | ICD-10-CM | POA: Insufficient documentation

## 2019-08-13 DIAGNOSIS — Z331 Pregnant state, incidental: Secondary | ICD-10-CM

## 2019-08-13 DIAGNOSIS — Z87891 Personal history of nicotine dependence: Secondary | ICD-10-CM | POA: Insufficient documentation

## 2019-08-13 DIAGNOSIS — O99019 Anemia complicating pregnancy, unspecified trimester: Secondary | ICD-10-CM | POA: Diagnosis not present

## 2019-08-13 DIAGNOSIS — Z20828 Contact with and (suspected) exposure to other viral communicable diseases: Secondary | ICD-10-CM | POA: Diagnosis not present

## 2019-08-13 DIAGNOSIS — T8331XA Breakdown (mechanical) of intrauterine contraceptive device, initial encounter: Secondary | ICD-10-CM

## 2019-08-13 DIAGNOSIS — O009 Unspecified ectopic pregnancy without intrauterine pregnancy: Secondary | ICD-10-CM | POA: Diagnosis present

## 2019-08-13 DIAGNOSIS — O00202 Left ovarian pregnancy without intrauterine pregnancy: Secondary | ICD-10-CM | POA: Diagnosis not present

## 2019-08-13 DIAGNOSIS — O00102 Left tubal pregnancy without intrauterine pregnancy: Secondary | ICD-10-CM | POA: Insufficient documentation

## 2019-08-13 DIAGNOSIS — D649 Anemia, unspecified: Secondary | ICD-10-CM | POA: Diagnosis not present

## 2019-08-13 HISTORY — DX: Unspecified ectopic pregnancy without intrauterine pregnancy: O00.90

## 2019-08-13 HISTORY — DX: Alcohol dependence, uncomplicated: F10.20

## 2019-08-13 HISTORY — PX: DIAGNOSTIC LAPAROSCOPY WITH REMOVAL OF ECTOPIC PREGNANCY: SHX6449

## 2019-08-13 LAB — CBC WITH DIFFERENTIAL/PLATELET
Abs Immature Granulocytes: 0.02 10*3/uL (ref 0.00–0.07)
Basophils Absolute: 0 10*3/uL (ref 0.0–0.1)
Basophils Relative: 0 %
Eosinophils Absolute: 0.1 10*3/uL (ref 0.0–0.5)
Eosinophils Relative: 1 %
HCT: 32.6 % — ABNORMAL LOW (ref 36.0–46.0)
Hemoglobin: 9.8 g/dL — ABNORMAL LOW (ref 12.0–15.0)
Immature Granulocytes: 0 %
Lymphocytes Relative: 22 %
Lymphs Abs: 1.5 10*3/uL (ref 0.7–4.0)
MCH: 24.7 pg — ABNORMAL LOW (ref 26.0–34.0)
MCHC: 30.1 g/dL (ref 30.0–36.0)
MCV: 82.1 fL (ref 80.0–100.0)
Monocytes Absolute: 0.7 10*3/uL (ref 0.1–1.0)
Monocytes Relative: 10 %
Neutro Abs: 4.5 10*3/uL (ref 1.7–7.7)
Neutrophils Relative %: 67 %
Platelets: 319 10*3/uL (ref 150–400)
RBC: 3.97 MIL/uL (ref 3.87–5.11)
RDW: 15.9 % — ABNORMAL HIGH (ref 11.5–15.5)
WBC: 6.8 10*3/uL (ref 4.0–10.5)
nRBC: 0 % (ref 0.0–0.2)

## 2019-08-13 LAB — BASIC METABOLIC PANEL
Anion gap: 9 (ref 5–15)
BUN: 11 mg/dL (ref 6–20)
CO2: 21 mmol/L — ABNORMAL LOW (ref 22–32)
Calcium: 9.2 mg/dL (ref 8.9–10.3)
Chloride: 110 mmol/L (ref 98–111)
Creatinine, Ser: 0.56 mg/dL (ref 0.44–1.00)
GFR calc Af Amer: >60 mL/min (ref >60)
GFR calc non Af Amer: >60 mL/min (ref >60)
Glucose, Bld: 100 mg/dL — ABNORMAL HIGH (ref 70–99)
Potassium: 4.3 mmol/L (ref 3.5–5.1)
Sodium: 140 mmol/L (ref 135–145)

## 2019-08-13 LAB — URINALYSIS, MICROSCOPIC (REFLEX)

## 2019-08-13 LAB — URINALYSIS, ROUTINE W REFLEX MICROSCOPIC
Bilirubin Urine: NEGATIVE
Glucose, UA: NEGATIVE mg/dL
Ketones, ur: NEGATIVE mg/dL
Leukocytes,Ua: NEGATIVE
Nitrite: NEGATIVE
Protein, ur: NEGATIVE mg/dL
Specific Gravity, Urine: 1.025 (ref 1.005–1.030)
pH: 6.5 (ref 5.0–8.0)

## 2019-08-13 LAB — HCG, QUANTITATIVE, PREGNANCY: hCG, Beta Chain, Quant, S: 6221 m[IU]/mL — ABNORMAL HIGH (ref <5)

## 2019-08-13 LAB — WET PREP, GENITAL
Sperm: NONE SEEN
Trich, Wet Prep: NONE SEEN
Yeast Wet Prep HPF POC: NONE SEEN

## 2019-08-13 LAB — TYPE AND SCREEN
ABO/RH(D): O POS
Antibody Screen: NEGATIVE

## 2019-08-13 LAB — ABO/RH: ABO/RH(D): O POS

## 2019-08-13 LAB — PREGNANCY, URINE: Preg Test, Ur: POSITIVE — AB

## 2019-08-13 LAB — SARS CORONAVIRUS 2 BY RT PCR (HOSPITAL ORDER, PERFORMED IN ~~LOC~~ HOSPITAL LAB): SARS Coronavirus 2: NEGATIVE

## 2019-08-13 SURGERY — LAPAROSCOPY, WITH ECTOPIC PREGNANCY SURGICAL TREATMENT
Anesthesia: General | Site: Abdomen

## 2019-08-13 MED ORDER — FENTANYL CITRATE (PF) 250 MCG/5ML IJ SOLN
INTRAMUSCULAR | Status: AC
  Filled 2019-08-13: qty 5

## 2019-08-13 MED ORDER — PROPOFOL 10 MG/ML IV BOLUS
INTRAVENOUS | Status: AC
  Filled 2019-08-13: qty 20

## 2019-08-13 MED ORDER — SODIUM CHLORIDE 0.9 % IR SOLN
Status: DC | PRN
  Administered 2019-08-13: 1000 mL

## 2019-08-13 MED ORDER — ACETAMINOPHEN 500 MG PO TABS: 1000.0000 mg | ORAL_TABLET | Freq: Once | ORAL | Status: DC

## 2019-08-13 MED ORDER — SODIUM CHLORIDE 0.9 % IV SOLN
INTRAVENOUS | Status: DC | PRN
  Administered 2019-08-13: 60 mL

## 2019-08-13 MED ORDER — HEMOSTATIC AGENTS (NO CHARGE) OPTIME
TOPICAL | Status: DC | PRN
  Administered 2019-08-13: 1 via TOPICAL

## 2019-08-13 MED ORDER — HYDROCODONE-ACETAMINOPHEN 5-325 MG PO TABS
2.0000 | ORAL_TABLET | Freq: Once | ORAL | Status: AC
  Administered 2019-08-13: 2 via ORAL
  Filled 2019-08-13: qty 2

## 2019-08-13 MED ORDER — HYDROMORPHONE HCL 1 MG/ML IJ SOLN
INTRAMUSCULAR | Status: DC | PRN
  Administered 2019-08-13: 0.5 mg via INTRAVENOUS

## 2019-08-13 MED ORDER — ROPIVACAINE HCL 5 MG/ML IJ SOLN
INTRAMUSCULAR | Status: AC
  Filled 2019-08-13: qty 30

## 2019-08-13 MED ORDER — FENTANYL CITRATE (PF) 250 MCG/5ML IJ SOLN
INTRAMUSCULAR | Status: DC | PRN
  Administered 2019-08-13: 150 ug via INTRAVENOUS
  Administered 2019-08-13: 25 ug via INTRAVENOUS
  Administered 2019-08-13: 50 ug via INTRAVENOUS
  Administered 2019-08-13: 25 ug via INTRAVENOUS

## 2019-08-13 MED ORDER — DEXMEDETOMIDINE HCL 200 MCG/2ML IV SOLN
INTRAVENOUS | Status: DC | PRN
  Administered 2019-08-13 (×2): 8 ug via INTRAVENOUS
  Administered 2019-08-13: 4 ug via INTRAVENOUS

## 2019-08-13 MED ORDER — SODIUM CHLORIDE 0.9 % IV BOLUS
1000.0000 mL | Freq: Once | INTRAVENOUS | Status: AC
  Administered 2019-08-13: 1000 mL via INTRAVENOUS

## 2019-08-13 MED ORDER — OXYCODONE-ACETAMINOPHEN 5-325 MG PO TABS: 1.0000 | ORAL_TABLET | ORAL | 0 refills | Status: AC | PRN

## 2019-08-13 MED ORDER — MIDAZOLAM HCL 5 MG/5ML IJ SOLN
INTRAMUSCULAR | Status: DC | PRN
  Administered 2019-08-13: 2 mg via INTRAVENOUS

## 2019-08-13 MED ORDER — METHYLERGONOVINE MALEATE 0.2 MG/ML IJ SOLN
INTRAMUSCULAR | Status: AC
  Filled 2019-08-13: qty 1

## 2019-08-13 MED ORDER — HYDROMORPHONE HCL 1 MG/ML IJ SOLN
INTRAMUSCULAR | Status: AC
  Filled 2019-08-13: qty 0.5

## 2019-08-13 MED ORDER — CARBOPROST TROMETHAMINE 250 MCG/ML IM SOLN
INTRAMUSCULAR | Status: AC
  Filled 2019-08-13: qty 1

## 2019-08-13 MED ORDER — DEXAMETHASONE SODIUM PHOSPHATE 10 MG/ML IJ SOLN
INTRAMUSCULAR | Status: DC | PRN
  Administered 2019-08-13: 4 mg via INTRAVENOUS

## 2019-08-13 MED ORDER — PROPOFOL 10 MG/ML IV BOLUS
INTRAVENOUS | Status: DC | PRN
  Administered 2019-08-13: 50 mg via INTRAVENOUS
  Administered 2019-08-13: 200 mg via INTRAVENOUS

## 2019-08-13 MED ORDER — DEXAMETHASONE SODIUM PHOSPHATE 10 MG/ML IJ SOLN
INTRAMUSCULAR | Status: AC
Start: 2019-08-13 — End: ?
  Filled 2019-08-13: qty 1

## 2019-08-13 MED ORDER — LACTATED RINGERS IV SOLN
INTRAVENOUS | Status: DC
  Administered 2019-08-13: 14:00:00 via INTRAVENOUS

## 2019-08-13 MED ORDER — ESMOLOL HCL 100 MG/10ML IV SOLN
INTRAVENOUS | Status: AC
  Filled 2019-08-13: qty 20

## 2019-08-13 MED ORDER — MIDAZOLAM HCL 2 MG/2ML IJ SOLN
INTRAMUSCULAR | Status: AC
  Filled 2019-08-13: qty 2

## 2019-08-13 MED ORDER — LIDOCAINE 2% (20 MG/ML) 5 ML SYRINGE
INTRAMUSCULAR | Status: DC | PRN
  Administered 2019-08-13: 60 mg via INTRAVENOUS

## 2019-08-13 MED ORDER — ONDANSETRON HCL 4 MG/2ML IJ SOLN
INTRAMUSCULAR | Status: DC | PRN
  Administered 2019-08-13: 4 mg via INTRAVENOUS

## 2019-08-13 MED ORDER — ONDANSETRON 8 MG PO TBDP
8.0000 mg | ORAL_TABLET | Freq: Once | ORAL | Status: AC
  Administered 2019-08-13: 8 mg via ORAL
  Filled 2019-08-13: qty 1

## 2019-08-13 MED ORDER — ROCURONIUM BROMIDE 10 MG/ML (PF) SYRINGE
PREFILLED_SYRINGE | INTRAVENOUS | Status: AC
  Filled 2019-08-13: qty 10

## 2019-08-13 MED ORDER — NAPROXEN 250 MG PO TABS
500.0000 mg | ORAL_TABLET | Freq: Once | ORAL | Status: AC
  Administered 2019-08-13: 500 mg via ORAL
  Filled 2019-08-13: qty 2

## 2019-08-13 MED ORDER — SODIUM CHLORIDE (PF) 0.9 % IJ SOLN: INTRAMUSCULAR | Status: DC | PRN

## 2019-08-13 MED ORDER — SUGAMMADEX SODIUM 200 MG/2ML IV SOLN
INTRAVENOUS | Status: DC | PRN
  Administered 2019-08-13: 200 mg via INTRAVENOUS

## 2019-08-13 MED ORDER — LACTATED RINGERS IV SOLN
INTRAVENOUS | Status: DC | PRN
  Administered 2019-08-13: 14:00:00 via INTRAVENOUS

## 2019-08-13 MED ORDER — ROCURONIUM BROMIDE 10 MG/ML (PF) SYRINGE
PREFILLED_SYRINGE | INTRAVENOUS | Status: DC | PRN
  Administered 2019-08-13: 100 mg via INTRAVENOUS

## 2019-08-13 MED ORDER — LACTATED RINGERS IV BOLUS: 1000.0000 mL | Freq: Once | INTRAVENOUS | Status: DC

## 2019-08-13 MED ORDER — PHENYLEPHRINE 40 MCG/ML (10ML) SYRINGE FOR IV PUSH (FOR BLOOD PRESSURE SUPPORT)
PREFILLED_SYRINGE | INTRAVENOUS | Status: DC | PRN
  Administered 2019-08-13: 80 ug via INTRAVENOUS

## 2019-08-13 SURGICAL SUPPLY — 44 items
APPLICATOR ARISTA FLEXITIP XL (MISCELLANEOUS) ×2 IMPLANT
CABLE HIGH FREQUENCY MONO STRZ (ELECTRODE) IMPLANT
CATH ROBINSON RED A/P 16FR (CATHETERS) IMPLANT
DERMABOND ADVANCED (GAUZE/BANDAGES/DRESSINGS) ×1
DERMABOND ADVANCED .7 DNX12 (GAUZE/BANDAGES/DRESSINGS) ×1 IMPLANT
DRSG OPSITE POSTOP 3X4 (GAUZE/BANDAGES/DRESSINGS) IMPLANT
DURAPREP 26ML APPLICATOR (WOUND CARE) ×2 IMPLANT
GLOVE BIO SURGEON STRL SZ7 (GLOVE) ×2 IMPLANT
GLOVE BIOGEL PI IND STRL 7.0 (GLOVE) ×3 IMPLANT
GLOVE BIOGEL PI INDICATOR 7.0 (GLOVE) ×3
GOWN STRL REUS W/ TWL LRG LVL3 (GOWN DISPOSABLE) ×2 IMPLANT
GOWN STRL REUS W/TWL LRG LVL3 (GOWN DISPOSABLE) ×2
HEMOSTAT ARISTA ABSORB 3G PWDR (HEMOSTASIS) ×2 IMPLANT
KIT TURNOVER KIT B (KITS) ×2 IMPLANT
LIGASURE VESSEL 5MM BLUNT TIP (ELECTROSURGICAL) ×2 IMPLANT
NEEDLE INSUFFLATION 14GA 120MM (NEEDLE) ×2 IMPLANT
NS IRRIG 1000ML POUR BTL (IV SOLUTION) ×2 IMPLANT
PACK LAPAROSCOPY BASIN (CUSTOM PROCEDURE TRAY) ×2 IMPLANT
PACK TRENDGUARD 450 HYBRID PRO (MISCELLANEOUS) ×1 IMPLANT
POUCH LAPAROSCOPIC INSTRUMENT (MISCELLANEOUS) ×2 IMPLANT
POUCH SPECIMEN RETRIEVAL 10MM (ENDOMECHANICALS) ×2 IMPLANT
PROTECTOR NERVE ULNAR (MISCELLANEOUS) ×4 IMPLANT
SCISSORS LAP 5X35 DISP (ENDOMECHANICALS) ×2 IMPLANT
SEALER TISSUE G2 CVD JAW 35 (ENDOMECHANICALS) IMPLANT
SEALER TISSUE G2 CVD JAW 45CM (ENDOMECHANICALS)
SET IRRIG TUBING LAPAROSCOPIC (IRRIGATION / IRRIGATOR) ×2 IMPLANT
SET TRI-LUMEN FLTR TB AIRSEAL (TUBING) IMPLANT
SET TUBE SMOKE EVAC HIGH FLOW (TUBING) ×2 IMPLANT
SLEEVE ENDOPATH XCEL 5M (ENDOMECHANICALS) ×2 IMPLANT
SOLUTION ELECTROLUBE (MISCELLANEOUS) IMPLANT
SUT VIC AB 2-0 UR5 27 (SUTURE) ×4 IMPLANT
SUT VICRYL RAPIDE 3 0 (SUTURE) ×4 IMPLANT
SYR 50ML LL SCALE MARK (SYRINGE) ×2 IMPLANT
SYR 5ML LL (SYRINGE) ×2 IMPLANT
TOWEL GREEN STERILE FF (TOWEL DISPOSABLE) ×2 IMPLANT
TRENDGUARD 450 HYBRID PRO PACK (MISCELLANEOUS) ×2
TROCAR 12M 150ML BLUNT (TROCAR) ×2 IMPLANT
TROCAR BLADELESS 11MM (ENDOMECHANICALS) ×2 IMPLANT
TROCAR BLADELESS 5MM (ENDOMECHANICALS) ×2 IMPLANT
TROCAR OPTI TIP 5M 100M (ENDOMECHANICALS) IMPLANT
TROCAR PORT AIRSEAL 5X120 (TROCAR) IMPLANT
TROCAR PORT AIRSEAL 8X120 (TROCAR) IMPLANT
TROCAR XCEL DIL TIP R 11M (ENDOMECHANICALS) ×2 IMPLANT
WARMER LAPAROSCOPE (MISCELLANEOUS) ×2 IMPLANT

## 2019-08-13 NOTE — ED Provider Notes (Signed)
[facility] Provider Note: Lowella Dell, MD, FACEP  CSN: [id number] MRN: [id number] ARRIVAL: 08/13/19 at 0622 ROOM: MH10/MH10   CHIEF COMPLAINT  Pelvic Pain   HISTORY OF PRESENT ILLNESS  08/13/19 6:43 AM Diana Hudson is a 33 y.o. female complains of 5 days of pain in her suprapubic and vaginal region.  She describes the pain as both throbbing and pressure.  She has an IUD (Mirena) in place which is been there for several years.  The pain has been waxing and waning and worsened this morning.  She rates it as an 8 out of 10 now, worse with movement or palpation.  She has had associated vaginal bleeding, which she describes as heavy, but no dysuria or fever.  She has had some transient nausea but no vomiting.    Past Medical History:  Diagnosis Date   Alcoholism (HCC)    Anemia    Bronchitis    Ectopic pregnancy    Hx of varicella     Past Surgical History:  Procedure Laterality Date   NO PAST SURGERIES      Family History  Problem Relation Age of Onset   Stroke Mother    Hypertension Mother    Migraines Mother    Seizures Mother    Thrombophlebitis Mother    Diabetes Father    Hyperlipidemia Father    Hypertension Father    Vision loss Father        glacoma   Rashes / Skin problems Brother    Seizures Brother    Rheum arthritis Neg Hx    Osteoarthritis Neg Hx    Asthma Neg Hx    Cancer Neg Hx    Heart failure Neg Hx    Thyroid disease Neg Hx     Social History   Tobacco Use   Smoking status: Former Smoker    Packs/day: 0.50    Quit date: 12/03/2007    Years since quitting: 11.7   Smokeless tobacco: Never Used  Substance Use Topics   Alcohol use: Yes    Alcohol/week: 7.0 standard drinks    Types: 7 Glasses of wine per week    Comment: stopped with +UPT   Drug use: No    Prior to Admission medications   Medication Sig Start Date End Date Taking? Authorizing Provider  docusate sodium (COLACE) 100 MG capsule  Take 1 capsule (100 mg total) by mouth 2 (two) times daily. 03/09/15   Waynard Reeds, MD  ibuprofen (ADVIL,MOTRIN) 600 MG tablet Take 1 tablet (600 mg total) by mouth every 6 (six) hours as needed. 03/09/15   Waynard Reeds, MD  IRON PO Take 1 tablet by mouth daily.    [provider]  oxyCODONE-acetaminophen (ROXICET) 5-325 MG tablet Take 1-2 tablets by mouth every 4 (four) hours as needed. May take 1-2 tablets every 4-6 hours as needed for pain 08/13/19   Carrington Clamp, MD  Prenatal Vit-Fe Fumarate-FA (PRENATAL MULTIVITAMIN) TABS tablet Take 1 tablet by mouth daily at 12 noon.    [provider]    Allergies Citrus   REVIEW OF SYSTEMS  Negative except as noted here or in the History of Present Illness.   PHYSICAL EXAMINATION  Initial Vital Signs Blood pressure 130/79, pulse 84, temperature 99.4 F (37.4 C), temperature source Oral, resp. rate 20, height 5\' 3"  (1.6 m), weight 99.8 kg, SpO2 98 %, unknown if currently breastfeeding.  Examination General: Well-developed, well-nourished female in no acute distress; appearance consistent with age of  record HENT: normocephalic; atraumatic Eyes: pupils equal, round and reactive to light; extraocular muscles intact Neck: supple Heart: regular rate and rhythm Lungs: clear to auscultation bilaterally Abdomen: soft; nondistended; suprapubic tenderness; bowel sounds present GU: Normal external genitalia; vaginal bleeding; IUD strings protruding from cervical loss; cervical motion tenderness; no adnexal tenderness Extremities: No deformity; full range of motion; pulses normal Neurologic: Awake, alert and oriented; motor function intact in all extremities and symmetric; no facial droop Skin: Warm and dry Psychiatric: Normal mood and affect   RESULTS  Summary of this visit's results, reviewed by myself:   EKG Interpretation  Date/Time:    Ventricular Rate:    PR Interval:    QRS Duration:   QT Interval:    QTC  Calculation:   R Axis:     Text Interpretation:        Laboratory Studies: Results for orders placed or performed during the hospital encounter of 08/13/19 (from the past 24 hour(s))  Urinalysis, Routine w reflex microscopic     Status: Abnormal   Collection Time: 08/13/19  6:45 AM  Result Value Ref Range   Color, Urine PINK (A) YELLOW   APPearance CLOUDY (A) CLEAR   Specific Gravity, Urine 1.025 1.005 - 1.030   pH 6.5 5.0 - 8.0   Glucose, UA NEGATIVE NEGATIVE mg/dL   Hgb urine dipstick LARGE (A) NEGATIVE   Bilirubin Urine NEGATIVE NEGATIVE   Ketones, ur NEGATIVE NEGATIVE mg/dL   Protein, ur NEGATIVE NEGATIVE mg/dL   Nitrite NEGATIVE NEGATIVE   Leukocytes,Ua NEGATIVE NEGATIVE  Pregnancy, urine     Status: Abnormal   Collection Time: 08/13/19  6:45 AM  Result Value Ref Range   Preg Test, Ur POSITIVE (A) NEGATIVE  Wet prep, genital     Status: Abnormal   Collection Time: 08/13/19  6:45 AM   Specimen: Vaginal  Result Value Ref Range   Yeast Wet Prep HPF POC NONE SEEN NONE SEEN   Trich, Wet Prep NONE SEEN NONE SEEN   Clue Cells Wet Prep HPF POC PRESENT (A) NONE SEEN   WBC, Wet Prep HPF POC MODERATE (A) NONE SEEN   Sperm NONE SEEN   Urinalysis, Microscopic (reflex)     Status: Abnormal   Collection Time: 08/13/19  6:45 AM  Result Value Ref Range   RBC / HPF 0-5 0 - 5 RBC/hpf   WBC, UA 0-5 0 - 5 WBC/hpf   Bacteria, UA MANY (A) NONE SEEN   Squamous Epithelial / LPF 11-20 0 - 5  hCG, quantitative, pregnancy     Status: Abnormal   Collection Time: 08/13/19  7:51 AM  Result Value Ref Range   hCG, Beta Chain, Quant, S 6,221 (H) <5 mIU/mL  Basic metabolic panel     Status: Abnormal   Collection Time: 08/13/19  7:51 AM  Result Value Ref Range   Sodium 140 135 - 145 mmol/L   Potassium 4.3 3.5 - 5.1 mmol/L   Chloride 110 98 - 111 mmol/L   CO2 21 (L) 22 - 32 mmol/L   Glucose, Bld 100 (H) 70 - 99 mg/dL   BUN 11 6 - 20 mg/dL   Creatinine, Ser 0.56 0.44 - 1.00 mg/dL   Calcium 9.2  8.9 - 10.3 mg/dL   GFR calc non Af Amer >60 >60 mL/min   GFR calc Af Amer >60 >60 mL/min   Anion gap 9 5 - 15  CBC with Differential     Status: Abnormal   Collection Time: 08/13/19  7:51 AM  Result Value Ref Range   WBC 6.8 4.0 - 10.5 K/uL   RBC 3.97 3.87 - 5.11 MIL/uL   Hemoglobin 9.8 (L) 12.0 - 15.0 g/dL   HCT 16.132.6 (L) 09.636.0 - 04.546.0 %   MCV 82.1 80.0 - 100.0 fL   MCH 24.7 (L) 26.0 - 34.0 pg   MCHC 30.1 30.0 - 36.0 g/dL   RDW 40.915.9 (H) 81.111.5 - 91.415.5 %   Platelets 319 150 - 400 K/uL   nRBC 0.0 0.0 - 0.2 %   Neutrophils Relative % 67 %   Neutro Abs 4.5 1.7 - 7.7 K/uL   Lymphocytes Relative 22 %   Lymphs Abs 1.5 0.7 - 4.0 K/uL   Monocytes Relative 10 %   Monocytes Absolute 0.7 0.1 - 1.0 K/uL   Eosinophils Relative 1 %   Eosinophils Absolute 0.1 0.0 - 0.5 K/uL   Basophils Relative 0 %   Basophils Absolute 0.0 0.0 - 0.1 K/uL   Immature Granulocytes 0 %   Abs Immature Granulocytes 0.02 0.00 - 0.07 K/uL  SARS Coronavirus 2 [facility](Hospital order, Performed in [facility] hospital lab) Nasopharyngeal Nasopharyngeal Swab     Status: None   Collection Time: 08/13/19 10:19 AM   Specimen: Nasopharyngeal Swab  Result Value Ref Range   SARS Coronavirus 2 NEGATIVE NEGATIVE  Type and screen     Status: None   Collection Time: 08/13/19 12:35 PM  Result Value Ref Range   ABO/RH(D) O POS    Antibody Screen NEG    Sample Expiration      08/16/2019,2359 Performed at [facility], [address] [place], [place] [address]   ABO/Rh     Status: None   Collection Time: 08/13/19 12:35 PM  Result Value Ref Range   ABO/RH(D)      O POS Performed at [facility], [address] [place], [place] [address]    Imaging Studies: Koreas Ob Comp < 14 Wks  Result Date: 08/13/2019 CLINICAL DATA:  Vaginal bleeding for 5 days. IUD. Positive pregnancy test. Unknown LMP. EXAM: OBSTETRIC <14 WK US AND TRANSVAGINAL OB US TECHNIQUE: Both transabdominal and transvaginal ultrasound examinations were performed  for complete evaluation of the gestation as well as the maternal uterus, adnexal regions, and pelvic cul-de-sac. Transvaginal technique was performed to assess early pregnancy. COMPARISON:  None. FINDINGS: Intrauterine gestational sac: None Maternal uterus/adnexae: IUD noted within the endometrial cavity. A small submucosal fibroid is seen in the posterior corpus measuring 1.2 cm. The right ovary is normal in appearance. The left ovary shows 2 hemorrhagic corpus lutea, largest measuring 4.0 cm. In addition, there is an oblong heterogeneous hypoechoic area which is contiguous with the inferior margin of the left ovary, measuring 4.7 x 1.9 x 1.8 cm. A small amount of free fluid is also seen in the left adnexa. IMPRESSION: No IUP visualized.  IUD noted within the endometrial cavity. 4.7 cm oblong hypoechoic area contiguous with the inferior margin of the left ovary, suspicious for ectopic pregnancy. Small amount of free fluid in left adnexa. Critical Value/emergent results were called by telephone at the time of interpretation on 08/13/2019 at 9:13 am to providerJULIE HAVILAND , who verbally acknowledged these results. Electronically Signed   By: Danae OrleansJohn A Stahl M.D.   On: 08/13/2019 09:16   Koreas Ob Transvaginal  Result Date: 08/13/2019 CLINICAL DATA:  Vaginal bleeding for 5 days. IUD. Positive pregnancy test. Unknown LMP. EXAM: OBSTETRIC <14 WK US AND TRANSVAGINAL OB US TECHNIQUE:  Both transabdominal and transvaginal ultrasound examinations were performed for complete evaluation of the gestation as well as the maternal uterus, adnexal regions, and pelvic cul-de-sac. Transvaginal technique was performed to assess early pregnancy. COMPARISON:  None. FINDINGS: Intrauterine gestational sac: None Maternal uterus/adnexae: IUD noted within the endometrial cavity. A small submucosal fibroid is seen in the posterior corpus measuring 1.2 cm. The right ovary is normal in appearance. The left ovary shows 2 hemorrhagic corpus lutea,  largest measuring 4.0 cm. In addition, there is an oblong heterogeneous hypoechoic area which is contiguous with the inferior margin of the left ovary, measuring 4.7 x 1.9 x 1.8 cm. A small amount of free fluid is also seen in the left adnexa. IMPRESSION: No IUP visualized.  IUD noted within the endometrial cavity. 4.7 cm oblong hypoechoic area contiguous with the inferior margin of the left ovary, suspicious for ectopic pregnancy. Small amount of free fluid in left adnexa. Critical Value/emergent results were called by telephone at the time of interpretation on 08/13/2019 at 9:13 am to providerJULIE HAVILAND , who verbally acknowledged these results. Electronically Signed   By: Danae OrleansJohn A Stahl M.D.   On: 08/13/2019 09:16    ED COURSE and MDM  Nursing notes and initial vitals signs, including pulse oximetry, reviewed.  Vitals:   08/13/19 0924 08/13/19 1127 08/13/19 1522 08/13/19 1541  BP: 122/70 137/83 123/63 116/72  Pulse: 75 75 96 78  Resp: 14 16 (!) 23 18  Temp:  97.6 F (36.4 C) 98.4 F (36.9 C)   TempSrc:      SpO2: 100%  100% 99%  Weight:      Height:       6:52 AM Will obtain pelvic ultrasound to evaluate reproductive tract.   6:59 AM Signed out to Dr. Particia NearingHaviland.  PROCEDURES    ED DIAGNOSES     ICD-10-CM   1. Left ovarian pregnancy without intrauterine pregnancy  O00.202       Phat Dalton, MD 08/13/19 2238

## 2019-08-13 NOTE — ED Provider Notes (Signed)
Pt signed out by Dr. Florina Ou pending labs.  Preg test positive with an IUD in place.  Blood type O+.  Quant 6221.  Pelvic US done which shows:    IMPRESSION:  No IUP visualized. IUD noted within the endometrial cavity.    4.7 cm oblong hypoechoic area contiguous with the inferior margin of  the left ovary, suspicious for ectopic pregnancy. Small amount of  free fluid in left adnexa.   Pt's vitals remain stable and she looks nontoxic.  Pt d/w Dr. Rosana Hoes (OBGyn) who said pt needs to be transferred to the MAU.  The pt will be transferred via [place].  CRITICAL CARE Performed by: Isla Pence   Total critical care time: 30 minutes  Critical care time was exclusive of separately billable procedures and treating other patients.  Critical care was necessary to treat or prevent imminent or life-threatening deterioration.  Critical care was time spent personally by me on the following activities: development of treatment plan with patient and/or surrogate as well as nursing, discussions with consultants, evaluation of patient's response to treatment, examination of patient, obtaining history from patient or surrogate, ordering and performing treatments and interventions, ordering and review of laboratory studies, ordering and review of radiographic studies, pulse oximetry and re-evaluation of patient's condition.   Isla Pence, MD 08/13/19 1015

## 2019-08-13 NOTE — Op Note (Signed)
08/13/2019  3:24 PM  PATIENT:  Diana Hudson  33 y.o. female  PRE-OPERATIVE DIAGNOSIS:  Left tubal pregnancy without intrauterine pregnancy  POST-OPERATIVE DIAGNOSIS:  Left tubal pregnancy without intrauterine pregnancy  PROCEDURE:  Procedure(s): DIAGNOSTIC LAPAROSCOPY WITH REMOVAL OF ECTOPIC PREGNANCY (N/A); L salpingectomy, removal of IUD.   SURGEON:  Surgeon(s) and Role:    * Bobbye Charleston, MD - Primary  ASSISTANTS: Dr. Otila Kluver   ANESTHESIA:   general  EBL:  75 mL   LOCAL MEDICATIONS USED:  OTHER Ropivicaine, Arrista  SPECIMEN:  Source of Specimen:  Left tube  DISPOSITION OF SPECIMEN:  PATHOLOGY  COUNTS:  YES  TOURNIQUET:  * No tourniquets in log *  DICTATION: .Note written in EPIC  PLAN OF CARE: Discharge to home after PACU  PATIENT DISPOSITION:  PACU - hemodynamically stable.   Delay start of Pharmacological VTE agent (>24hrs) due to surgical blood loss or risk of bleeding: not applicable Complications: none  Medications:  None  Findings:  L tube with ectopic and torsed on itself.  Ashesions of bowel to ovary to peritoneum to tube.  About 50 cc of blood clot in cul de sac.  Fimbriae dilated and bleeding coming from end.    Technique: After adequate general anesthesia was achieved, the patient was prepped and draped in the usual fashion.  The speculum was placed in the vagina and the IUD strings identified and grasped with a ring forceps.  The IUD was removed without complication. A uterine manipulator was placed in the cervix and secured.  The bladder was drained with a red rubber cath.  A two cm incision was made above the umbilicus and the veress needle passed inside without aspiration of bowel contents or blood.  However, I was not able to get in on 3 tries. The 12 mm optiview trocar was placed without comp and the abdomen was insufflated.  The camera was placed inside and two 5 mm trocars placed 3 above the right iliac crests under direct  visualization of the camera.   The above findings were noted and the tube was deemed to be too damaged to save.  A grasper and ligasure were placed in the lower trocars and the tube tented up and removed by cauterizing and cutting the mesosalpinx.  Several adhesions were safely removed in the same way.  The ovary had some bleeding where it was cut by one of the graspers and this bleeding was controlled with cautery and Arrista.    The scope was changed to the 67mm and the endobag placed in the abdomen.  The tube was placed in the bag and removed through the 12 mm trocar incision.  The pelvis had been irrigated and now was inspected and found to be hemostatic. the instruments were removed from the abdomen, the ropivicaine injected into the 43mm trocar and the abdomen desufflated.  The 12 mm trocar fascia was closed with a figure of eight stitch and the skin incisions closed with subq 3-0vicryl R.  The incisions were closed with dermabond.  The patient tolerated the procedure well and was returned to the recovery room in stable condition.    Jessy Calixte A

## 2019-08-13 NOTE — ED Notes (Signed)
Patient transported to Ultrasound 

## 2019-08-13 NOTE — MAU Note (Signed)
.   Diana Hudson is a 33 y.o. at [redacted]w[redacted]d here in MAU via [facility] for evaluation.Pt reports lower abdominal pain. Irregular cycles LMP: unsure irregular Onset of complaint: Sunday Pain score: 6 Vitals:   08/13/19 0924 08/13/19 1127  BP: 122/70 137/83  Pulse: 75 75  Resp: 14 16  Temp:  97.6 F (36.4 C)  SpO2: 100%      FHT: Lab orders placed from triage:

## 2019-08-13 NOTE — Progress Notes (Signed)
Report given to [facility], awaiting for transport for OR

## 2019-08-13 NOTE — Anesthesia Procedure Notes (Signed)
Procedure Name: Intubation Performed by: Milford Cage, CRNA Pre-anesthesia Checklist: Patient identified, Emergency Drugs available, Suction available and Patient being monitored Patient Re-evaluated:Patient Re-evaluated prior to induction Oxygen Delivery Method: Circle System Utilized Preoxygenation: Pre-oxygenation with 100% oxygen Induction Type: IV induction and Rapid sequence Laryngoscope Size: Miller and 2 Grade View: Grade I Tube type: Oral Tube size: 7.0 mm Number of attempts: 1 Airway Equipment and Method: Stylet and Oral airway Placement Confirmation: ETT inserted through vocal cords under direct vision,  positive ETCO2 and breath sounds checked- equal and bilateral Secured at: 24 cm Tube secured with: Tape Dental Injury: Teeth and Oropharynx as per pre-operative assessment

## 2019-08-13 NOTE — Transfer of Care (Signed)
Immediate Anesthesia Transfer of Care Note  Patient: Diana Hudson  Procedure(s) Performed: DIAGNOSTIC LAPAROSCOPY WITH REMOVAL OF ECTOPIC PREGNANCY (N/A Abdomen)  Patient Location: PACU  Anesthesia Type:General  Level of Consciousness: drowsy  Airway & Oxygen Therapy: Patient Spontanous Breathing  Post-op Assessment: Report given to RN and Post -op Vital signs reviewed and stable  Post vital signs: Reviewed and stable  Last Vitals:  Vitals Value Taken Time  BP 123/63 08/13/19 1523  Temp    Pulse 93 08/13/19 1524  Resp 22 08/13/19 1524  SpO2 100 % 08/13/19 1524  Vitals shown include unvalidated device data.  Last Pain:  Vitals:   08/13/19 1319  TempSrc:   PainSc: 0-No pain      Patients Stated Pain Goal: 4 (82/95/62 1308)  Complications: No apparent anesthesia complications

## 2019-08-13 NOTE — Anesthesia Preprocedure Evaluation (Signed)
Anesthesia Evaluation  Patient identified by MRN, date of birth, ID band Patient awake    Reviewed: Allergy & Precautions, H&P , NPO status , Patient's Chart, lab work & pertinent test results  Airway Mallampati: II  TM Distance: >3 FB Neck ROM: full    Dental no notable dental hx.    Pulmonary former smoker,    Pulmonary exam normal        Cardiovascular negative cardio ROS Normal cardiovascular exam     Neuro/Psych PSYCHIATRIC DISORDERS negative neurological ROS     GI/Hepatic negative GI ROS, Neg liver ROS,   Endo/Other  Morbid obesity  Renal/GU negative Renal ROS     Musculoskeletal   Abdominal Normal abdominal exam  (+) + obese,   Peds  Hematology  (+) anemia ,   Anesthesia Other Findings   Reproductive/Obstetrics (+) Pregnancy                             Anesthesia Physical  Anesthesia Plan  ASA: III  Anesthesia Plan: General   Post-op Pain Management:    Induction: Intravenous  PONV Risk Score and Plan: 3 and Ondansetron, Dexamethasone and Midazolam  Airway Management Planned: Oral ETT  Additional Equipment: None  Intra-op Plan:   Post-operative Plan:   Informed Consent: I have reviewed the patients History and Physical, chart, labs and discussed the procedure including the risks, benefits and alternatives for the proposed anesthesia with the patient or authorized representative who has indicated his/her understanding and acceptance.     Dental advisory given  Plan Discussed with: CRNA  Anesthesia Plan Comments:         Anesthesia Quick Evaluation

## 2019-08-13 NOTE — H&P (Signed)
GYN H&P/Preop  HPI   Ms.Jenessa Gillingham a 33 y.o.year old [phone] female at [redacted]w[redacted]d weeks gestation who arrived [facility] from [employer] for ectopic pregnancy.She reports she started having intermittent LT sided pain on Sunday 08/08/2019. She reports having "weird smells and tastes" the other day. She reports taking 2 shot last night "to numb the pain," so she could go to sleep. She "passed out" and woke up around 0330 to use the BR, went back to sleep, and then "woke straight up" out of her sleep with pain. She rates the pain she is having right now at a 6/10. She reports last having something to eat at 2100 on 08/12/2019 and last drink around 1000.  OB History    Gravida  6   Para  3   Term  3   Preterm      AB  2   Living  3     SAB      TAB      Ectopic      Multiple      Live Births  3          Past Surgical History:  Procedure Laterality Date  . NO PAST SURGERIES     Past Medical History:  Diagnosis Date  . Alcoholism ([place])   . Anemia   . Bronchitis   . Hx of varicella    Family History  Problem Relation Age of Onset  . Stroke Mother   . Hypertension Mother   . Migraines Mother   . Seizures Mother   . Thrombophlebitis Mother   . Diabetes Father   . Hyperlipidemia Father   . Hypertension Father   . Vision loss Father        glacoma  . Rashes / Skin problems Brother   . Seizures Brother   . Rheum arthritis Neg Hx   . Osteoarthritis Neg Hx   . Asthma Neg Hx   . Cancer Neg Hx   . Heart failure Neg Hx   . Thyroid disease Neg Hx     Review of Systems Per HPI   Objective:    BP 137/83   Pulse 75   Temp 97.6 F (36.4 C)   Resp 16   Ht 5\' 3"  (1.6 m)   Wt 99.8 kg   LMP 07/13/2019   SpO2 100%   BMI 38.97 kg/m   Examination per ER General: Well-developed, well-nourished female in no acute distress; appearance consistent with age of record HENT: normocephalic; atraumatic Eyes: pupils equal, round and reactive to light;  extraocular muscles intact Neck: supple Heart: regular rate and rhythm Lungs: clear to auscultation bilaterally Abdomen: soft; nondistended; suprapubic tenderness; bowel sounds present GU: Normal external genitalia; vaginal bleeding; IUD strings protruding from cervical loss; cervical motion tenderness; no adnexal tenderness Extremities: No deformity; full range of motion; pulses normal Neurologic: Awake, alert and oriented; motor function intact in all extremities and symmetric; no facial droop Skin: Warm and dry Psychiatric: Normal mood and affect  Ultrasound FINDINGS: Intrauterine gestational sac: None  Maternal uterus/adnexae: IUD noted within the endometrial cavity. A small submucosal fibroid is seen in the posterior corpus measuring 1.2 cm.  The right ovary is normal in appearance. The left ovary shows 2 hemorrhagic corpus lutea, largest measuring 4.0 cm. In addition, there is an oblong heterogeneous hypoechoic area which is contiguous with the inferior margin of the left ovary, measuring 4.7 x 1.9 x 1.8 cm. A small amount of free fluid is  also seen in the left adnexa.  IMPRESSION: No IUP visualized.  IUD noted within the endometrial cavity.  4.7 cm oblong hypoechoic area contiguous with the inferior margin of the left ovary, suspicious for ectopic pregnancy. Small amount of free fluid in left adnexa.  Beta HCG 08/13/2019 6,221    Assessment/Plan:   Bethanne GingerLakecia Hurwitz 33 y.o. [id number] at 4013w3d with ectopic pregnancy in setting of IUD 1. Ectopic pregnancy: On US 4.7cm hyoechoic area near the left ovary, no IUP, beta hcg = 6,221. Dr. Henderson CloudHorvath discussed findings with patient, recommended proceeding with surgery, consented for diagnostic laparoscopy, discussed salpingostomy vs. salpingectomy. Counseled on Procedure, risks, reasons, benefits and complications (including injury to bowel, bladder, major blood vessel, ureter, bleeding, possibility of transfusion, infection, or  fistula formation) reviewed in detail. Consent signed.  Blood type = pending Hgb 9.8 COVID test negative 08/13/19 Contraception: IUD in place, will remove in the OR  Annemarie Sebree K Taam-Akelman 08/13/19 12:45 PM

## 2019-08-13 NOTE — ED Notes (Signed)
Called to give report  Pt advocate to have rn return call to get report

## 2019-08-13 NOTE — OR Nursing (Signed)
IUD removed by Dr. Philis Pique during operative procedure.

## 2019-08-13 NOTE — Brief Op Note (Signed)
08/13/2019  3:24 PM  PATIENT:  Diana Hudson  33 y.o. female  PRE-OPERATIVE DIAGNOSIS:  Left ovarian pregnancy without intrauterine pregnancy  POST-OPERATIVE DIAGNOSIS:  Left ovarian pregnancy without intrauterine pregnancy  PROCEDURE:  Procedure(s): DIAGNOSTIC LAPAROSCOPY WITH REMOVAL OF ECTOPIC PREGNANCY (N/A)  SURGEON:  Surgeon(s) and Role:    * Bobbye Charleston, MD - Primary  ASSISTANTS: Dr. Benedetto Goad   ANESTHESIA:   general  EBL:  75 mL   LOCAL MEDICATIONS USED:  OTHER Ropivicaine, Arrista  SPECIMEN:  Source of Specimen:  Left tube  DISPOSITION OF SPECIMEN:  PATHOLOGY  COUNTS:  YES  TOURNIQUET:  * No tourniquets in log *  DICTATION: .Note written in EPIC  PLAN OF CARE: Discharge to home after PACU  PATIENT DISPOSITION:  PACU - hemodynamically stable.   Delay start of Pharmacological VTE agent (>24hrs) due to surgical blood loss or risk of bleeding: not applicable

## 2019-08-13 NOTE — ED Triage Notes (Signed)
Pt is c/o pain in her pelvic area and in her vaginal region  Pt states pain started on Sunday subsided and then woke her up out of her sleep this morning  Pt describes the pain in her vaginal area as a pressure  Pt has an IUD that she has had for a couple of years  States she started having vaginal bleeding on Sunday  Pt states the flow is still heavy

## 2019-08-13 NOTE — Progress Notes (Signed)
While waiting for the OR, the patient relayed to me that she is currently an alcoholic and drinking a pint to a pint and a half a day of vodka. The patient's significant other is to call me tonight to make sure she is not having nausea or vomiting and is on her current routine to prevent DTs.   We will address her alcoholism at her post op appt.

## 2019-08-13 NOTE — MAU Provider Note (Addendum)
History     CSN: [id number]  Arrival date and time: 08/13/19 2423   First Provider Initiated Contact with Patient 08/13/19 1145      Chief Complaint  Patient presents with  . Pelvic Pain   HPI  Ms.  Diana Hudson is a 33 y.o. year old [id number] female at [redacted]w[redacted]d weeks gestation who arrived in [facility]U via [facility] from [place] for ectopic pregnancy. She reports she started having intermittent LT sided pain on Sunday 08/08/2019. She is also having VB. She has an IUD that was placed 4 years ago at [facility].  She reports having "weird smells and tastes" the other day. She reports taking 2 shots (CONFIDENTIAL INFORMATION - pt admits to being an alcoholic) last night "to numb the pain," so she could go to sleep. She "passed out" and woke up around 0330 to use the BR, went back to sleep, and then "woke straight up" out of her sleep with pain. She rates the pain she is having right now at a 6/10. She reports last having something to eat at 2100 on 08/12/2019 and last drank around 1000. She denies any N/V/D, abnormal vaginal discharge, H/A, or dizziness.   Past Medical History:  Diagnosis Date  . Alcoholism ([place])   . Anemia   . Bronchitis   . Hx of varicella     Past Surgical History:  Procedure Laterality Date  . NO PAST SURGERIES      Family History  Problem Relation Age of Onset  . Stroke Mother   . Hypertension Mother   . Migraines Mother   . Seizures Mother   . Thrombophlebitis Mother   . Diabetes Father   . Hyperlipidemia Father   . Hypertension Father   . Vision loss Father        glacoma  . Rashes / Skin problems Brother   . Seizures Brother   . Rheum arthritis Neg Hx   . Osteoarthritis Neg Hx   . Asthma Neg Hx   . Cancer Neg Hx   . Heart failure Neg Hx   . Thyroid disease Neg Hx     Social History   Tobacco Use  . Smoking status: Former Smoker    Packs/day: 0.50    Quit date: 12/03/2007    Years since quitting: 11.7  . Smokeless tobacco: Never  Used  Substance Use Topics  . Alcohol use: Yes    Alcohol/week: 7.0 standard drinks    Types: 7 Glasses of wine per week    Comment: stopped with +UPT  . Drug use: No    Allergies:  Allergies  Allergen Reactions  . Citrus Other (See Comments)    Gets blisters around her mouth.    Medications Prior to Admission  Medication Sig Dispense Refill Last Dose  . docusate sodium (COLACE) 100 MG capsule Take 1 capsule (100 mg total) by mouth 2 (two) times daily. 60 capsule 0   . ibuprofen (ADVIL,MOTRIN) 600 MG tablet Take 1 tablet (600 mg total) by mouth every 6 (six) hours as needed. 90 tablet 0   . IRON PO Take 1 tablet by mouth daily.     Marland Kitchen oxyCODONE-acetaminophen (ROXICET) 5-325 MG per tablet Take 2 tablets by mouth every 4 (four) hours as needed. May take 1-2 tablets every 4-6 hours as needed for pain 30 tablet 0   . Prenatal Vit-Fe Fumarate-FA (PRENATAL MULTIVITAMIN) TABS tablet Take 1 tablet by mouth daily at 12 noon.  Review of Systems  Constitutional: Positive for appetite change ("weird taste in mouth").  HENT: Negative.   Eyes: Negative.   Respiratory: Negative.   Cardiovascular: Negative.   Gastrointestinal: Negative.   Endocrine: Negative.   Genitourinary: Positive for pelvic pain (LT side) and vaginal bleeding.  Musculoskeletal: Negative.   Allergic/Immunologic: Negative.   Neurological: Negative.   Hematological: Negative.   Psychiatric/Behavioral: Negative.    Physical Exam   Blood pressure 137/83, pulse 75, temperature 97.6 F (36.4 C), resp. rate 16, height 5\' 3"  (1.6 m), weight 99.8 kg, last menstrual period 07/13/2019, SpO2 100 %.  Physical Exam  Nursing note and vitals reviewed. Constitutional: She is oriented to person, place, and time. She appears well-developed and well-nourished.  HENT:  Head: Normocephalic and atraumatic.  Eyes: Pupils are equal, round, and reactive to light.  Neck: Normal range of motion.  Cardiovascular: Normal rate, regular  rhythm, normal heart sounds and intact distal pulses.  Respiratory: Effort normal and breath sounds normal.  GI: Soft. Bowel sounds are normal.  Genitourinary:    Genitourinary Comments: Deferred -- done at [facility]   Musculoskeletal: Normal range of motion.  Neurological: She is alert and oriented to person, place, and time. She has normal reflexes.  Skin: Skin is warm and dry.  Psychiatric: She has a normal mood and affect. Her behavior is normal. Judgment and thought content normal.    MAU Course  Procedures  MDM Prepare for OR Type & Screen  *Consult with Dr. Henderson Cloud @ 1200 - notified of patient's complaints, assessments, lab & U/S results - prep for OR, will come to evaluate pt and take to OR  Results for orders placed or performed during the hospital encounter of 08/13/19 (from the past 24 hour(s))  Urinalysis, Routine w reflex microscopic     Status: Abnormal   Collection Time: 08/13/19  6:45 AM  Result Value Ref Range   Color, Urine PINK (A) YELLOW   APPearance CLOUDY (A) CLEAR   Specific Gravity, Urine 1.025 1.005 - 1.030   pH 6.5 5.0 - 8.0   Glucose, UA NEGATIVE NEGATIVE mg/dL   Hgb urine dipstick LARGE (A) NEGATIVE   Bilirubin Urine NEGATIVE NEGATIVE   Ketones, ur NEGATIVE NEGATIVE mg/dL   Protein, ur NEGATIVE NEGATIVE mg/dL   Nitrite NEGATIVE NEGATIVE   Leukocytes,Ua NEGATIVE NEGATIVE  Pregnancy, urine     Status: Abnormal   Collection Time: 08/13/19  6:45 AM  Result Value Ref Range   Preg Test, Ur POSITIVE (A) NEGATIVE  Wet prep, genital     Status: Abnormal   Collection Time: 08/13/19  6:45 AM   Specimen: Vaginal  Result Value Ref Range   Yeast Wet Prep HPF POC NONE SEEN NONE SEEN   Trich, Wet Prep NONE SEEN NONE SEEN   Clue Cells Wet Prep HPF POC PRESENT (A) NONE SEEN   WBC, Wet Prep HPF POC MODERATE (A) NONE SEEN   Sperm NONE SEEN   Urinalysis, Microscopic (reflex)     Status: Abnormal   Collection Time: 08/13/19  6:45 AM  Result Value Ref Range    RBC / HPF 0-5 0 - 5 RBC/hpf   WBC, UA 0-5 0 - 5 WBC/hpf   Bacteria, UA MANY (A) NONE SEEN   Squamous Epithelial / LPF 11-20 0 - 5  hCG, quantitative, pregnancy     Status: Abnormal   Collection Time: 08/13/19  7:51 AM  Result Value Ref Range   hCG, Beta Chain, Quant, S 6,221 (H) <5  mIU/mL  Basic metabolic panel     Status: Abnormal   Collection Time: 08/13/19  7:51 AM  Result Value Ref Range   Sodium 140 135 - 145 mmol/L   Potassium 4.3 3.5 - 5.1 mmol/L   Chloride 110 98 - 111 mmol/L   CO2 21 (L) 22 - 32 mmol/L   Glucose, Bld 100 (H) 70 - 99 mg/dL   BUN 11 6 - 20 mg/dL   Creatinine, Ser 1.610.56 0.44 - 1.00 mg/dL   Calcium 9.2 8.9 - 09.610.3 mg/dL   GFR calc non Af Amer >60 >60 mL/min   GFR calc Af Amer >60 >60 mL/min   Anion gap 9 5 - 15  CBC with Differential     Status: Abnormal   Collection Time: 08/13/19  7:51 AM  Result Value Ref Range   WBC 6.8 4.0 - 10.5 K/uL   RBC 3.97 3.87 - 5.11 MIL/uL   Hemoglobin 9.8 (L) 12.0 - 15.0 g/dL   HCT 04.532.6 (L) 40.936.0 - 81.146.0 %   MCV 82.1 80.0 - 100.0 fL   MCH 24.7 (L) 26.0 - 34.0 pg   MCHC 30.1 30.0 - 36.0 g/dL   RDW 91.415.9 (H) 78.211.5 - 95.615.5 %   Platelets 319 150 - 400 K/uL   nRBC 0.0 0.0 - 0.2 %   Neutrophils Relative % 67 %   Neutro Abs 4.5 1.7 - 7.7 K/uL   Lymphocytes Relative 22 %   Lymphs Abs 1.5 0.7 - 4.0 K/uL   Monocytes Relative 10 %   Monocytes Absolute 0.7 0.1 - 1.0 K/uL   Eosinophils Relative 1 %   Eosinophils Absolute 0.1 0.0 - 0.5 K/uL   Basophils Relative 0 %   Basophils Absolute 0.0 0.0 - 0.1 K/uL   Immature Granulocytes 0 %   Abs Immature Granulocytes 0.02 0.00 - 0.07 K/uL  SARS Coronavirus 2 [facility](Hospital order, Performed [facility]) Nasopharyngeal Nasopharyngeal Swab     Status: None   Collection Time: 08/13/19 10:19 AM   Specimen: Nasopharyngeal Swab  Result Value Ref Range   SARS Coronavirus 2 NEGATIVE NEGATIVE    Assessment and Plan  Left ovarian pregnancy without intrauterine pregnancy - Explained to  patient she is not a candidate for MTX d/t the size of ectopic and risk for rupture - Explained that a ruptured ectopic pregnancy is life-threatening - To OR for surgical removal of ectopic pregnancy by Dr. Henderson CloudHorvath - See Dr. Kittie PlaterHorvath's documentation for H&P   Diana Moraolitta Jacere Pangborn, MSN, CNM 08/13/2019, 12:02 PM

## 2019-08-14 LAB — GC/CHLAMYDIA PROBE AMP (~~LOC~~) NOT AT ARMC
Chlamydia: NEGATIVE
Neisseria Gonorrhea: NEGATIVE

## 2019-08-15 NOTE — Anesthesia Postprocedure Evaluation (Signed)
Anesthesia Post Note  Patient: Diana Hudson  Procedure(s) Performed: DIAGNOSTIC LAPAROSCOPY WITH REMOVAL OF ECTOPIC PREGNANCY (N/A Abdomen)     Patient location during evaluation: PACU Anesthesia Type: General Level of consciousness: awake and alert Pain management: pain level controlled Vital Signs Assessment: post-procedure vital signs reviewed and stable Respiratory status: spontaneous breathing, nonlabored ventilation, respiratory function stable and patient connected to nasal cannula oxygen Cardiovascular status: blood pressure returned to baseline and stable Postop Assessment: no apparent nausea or vomiting Anesthetic complications: no    Last Vitals:  Vitals:   08/13/19 1522 08/13/19 1541  BP: 123/63 116/72  Pulse: 96 78  Resp: (!) 23 18  Temp: 36.9 C   SpO2: 100% 99%    Last Pain:  Vitals:   08/13/19 1541  TempSrc:   PainSc: 3                  Christyl Osentoski

## 2019-08-16 ENCOUNTER — Encounter (HOSPITAL_COMMUNITY): Payer: Self-pay | Admitting: Obstetrics and Gynecology

## 2019-10-20 DIAGNOSIS — Z124 Encounter for screening for malignant neoplasm of cervix: Secondary | ICD-10-CM | POA: Diagnosis not present

## 2019-10-20 DIAGNOSIS — Z6837 Body mass index (BMI) 37.0-37.9, adult: Secondary | ICD-10-CM | POA: Diagnosis not present

## 2019-10-20 DIAGNOSIS — Z01419 Encounter for gynecological examination (general) (routine) without abnormal findings: Secondary | ICD-10-CM | POA: Diagnosis not present

## 2020-03-07 DIAGNOSIS — Z03818 Encounter for observation for suspected exposure to other biological agents ruled out: Secondary | ICD-10-CM | POA: Diagnosis not present

## 2020-03-07 DIAGNOSIS — Z20828 Contact with and (suspected) exposure to other viral communicable diseases: Secondary | ICD-10-CM | POA: Diagnosis not present

## 2020-03-29 DIAGNOSIS — R5383 Other fatigue: Secondary | ICD-10-CM | POA: Diagnosis not present

## 2020-03-29 DIAGNOSIS — R7303 Prediabetes: Secondary | ICD-10-CM | POA: Diagnosis not present

## 2020-03-29 DIAGNOSIS — E559 Vitamin D deficiency, unspecified: Secondary | ICD-10-CM | POA: Diagnosis not present

## 2020-03-29 DIAGNOSIS — R635 Abnormal weight gain: Secondary | ICD-10-CM | POA: Diagnosis not present

## 2020-03-29 DIAGNOSIS — N951 Menopausal and female climacteric states: Secondary | ICD-10-CM | POA: Diagnosis not present

## 2020-03-29 DIAGNOSIS — D649 Anemia, unspecified: Secondary | ICD-10-CM | POA: Diagnosis not present

## 2020-04-12 DIAGNOSIS — F331 Major depressive disorder, recurrent, moderate: Secondary | ICD-10-CM | POA: Diagnosis not present

## 2020-04-12 DIAGNOSIS — Z1339 Encounter for screening examination for other mental health and behavioral disorders: Secondary | ICD-10-CM | POA: Diagnosis not present

## 2020-04-12 DIAGNOSIS — F1020 Alcohol dependence, uncomplicated: Secondary | ICD-10-CM | POA: Diagnosis not present

## 2020-04-12 DIAGNOSIS — D649 Anemia, unspecified: Secondary | ICD-10-CM | POA: Diagnosis not present

## 2020-04-12 DIAGNOSIS — Z1331 Encounter for screening for depression: Secondary | ICD-10-CM | POA: Diagnosis not present

## 2020-04-12 DIAGNOSIS — R7303 Prediabetes: Secondary | ICD-10-CM | POA: Diagnosis not present

## 2020-05-03 DIAGNOSIS — M2559 Pain in other specified joint: Secondary | ICD-10-CM | POA: Diagnosis not present

## 2020-05-17 DIAGNOSIS — Z6841 Body Mass Index (BMI) 40.0 and over, adult: Secondary | ICD-10-CM | POA: Diagnosis not present

## 2020-05-17 DIAGNOSIS — E559 Vitamin D deficiency, unspecified: Secondary | ICD-10-CM | POA: Diagnosis not present

## 2020-05-24 DIAGNOSIS — Z6841 Body Mass Index (BMI) 40.0 and over, adult: Secondary | ICD-10-CM | POA: Diagnosis not present

## 2020-05-24 DIAGNOSIS — R7303 Prediabetes: Secondary | ICD-10-CM | POA: Diagnosis not present

## 2021-02-08 ENCOUNTER — Ambulatory Visit (HOSPITAL_COMMUNITY)
Admit: 2021-02-08 | Discharge: 2021-02-08 | Disposition: A | Discharge status: Home / Self Care | Payer: BC Managed Care – PPO | Attending: Emergency Medicine | Admitting: Emergency Medicine

## 2021-02-08 ENCOUNTER — Other Ambulatory Visit: Payer: Self-pay

## 2021-02-08 ENCOUNTER — Emergency Department (HOSPITAL_BASED_OUTPATIENT_CLINIC_OR_DEPARTMENT_OTHER): Payer: BC Managed Care – PPO | Admitting: Radiology

## 2021-02-08 ENCOUNTER — Encounter (HOSPITAL_BASED_OUTPATIENT_CLINIC_OR_DEPARTMENT_OTHER): Payer: Self-pay

## 2021-02-08 ENCOUNTER — Emergency Department (HOSPITAL_BASED_OUTPATIENT_CLINIC_OR_DEPARTMENT_OTHER)
Admission: EM | Admit: 2021-02-08 | Discharge: 2021-02-08 | Disposition: A | Discharge status: Home / Self Care | Payer: BC Managed Care – PPO | Attending: Emergency Medicine | Admitting: Emergency Medicine

## 2021-02-08 DIAGNOSIS — R112 Nausea with vomiting, unspecified: Secondary | ICD-10-CM | POA: Diagnosis not present

## 2021-02-08 DIAGNOSIS — Z87891 Personal history of nicotine dependence: Secondary | ICD-10-CM | POA: Diagnosis not present

## 2021-02-08 DIAGNOSIS — Z751 Person awaiting admission to adequate facility elsewhere: Secondary | ICD-10-CM

## 2021-02-08 HISTORY — DX: Depression, unspecified: F32.A

## 2021-02-08 HISTORY — DX: Anxiety disorder, unspecified: F41.9

## 2021-02-08 LAB — CBC WITH DIFFERENTIAL/PLATELET
Abs Immature Granulocytes: 0.02 10*3/uL (ref 0.00–0.07)
Basophils Absolute: 0 10*3/uL (ref 0.0–0.1)
Basophils Relative: 1 %
Eosinophils Absolute: 0 10*3/uL (ref 0.0–0.5)
Eosinophils Relative: 0 %
HCT: 33.3 % — ABNORMAL LOW (ref 36.0–46.0)
Hemoglobin: 10.3 g/dL — ABNORMAL LOW (ref 12.0–15.0)
Immature Granulocytes: 0 %
Lymphocytes Relative: 17 %
Lymphs Abs: 1.5 10*3/uL (ref 0.7–4.0)
MCH: 24.6 pg — ABNORMAL LOW (ref 26.0–34.0)
MCHC: 30.9 g/dL (ref 30.0–36.0)
MCV: 79.7 fL — ABNORMAL LOW (ref 80.0–100.0)
Monocytes Absolute: 0.9 10*3/uL (ref 0.1–1.0)
Monocytes Relative: 11 %
Neutro Abs: 6 10*3/uL (ref 1.7–7.7)
Neutrophils Relative %: 71 %
Platelets: 284 10*3/uL (ref 150–400)
RBC: 4.18 MIL/uL (ref 3.87–5.11)
RDW: 16.1 % — ABNORMAL HIGH (ref 11.5–15.5)
WBC: 8.4 10*3/uL (ref 4.0–10.5)
nRBC: 0 % (ref 0.0–0.2)

## 2021-02-08 LAB — URINALYSIS, ROUTINE W REFLEX MICROSCOPIC
Bilirubin Urine: NEGATIVE
Glucose, UA: NEGATIVE mg/dL
Hgb urine dipstick: NEGATIVE
Ketones, ur: NEGATIVE mg/dL
Leukocytes,Ua: NEGATIVE
Nitrite: NEGATIVE
Specific Gravity, Urine: 1.028 (ref 1.005–1.030)
pH: 8 (ref 5.0–8.0)

## 2021-02-08 LAB — COMPREHENSIVE METABOLIC PANEL
ALT: 19 U/L (ref 0–44)
AST: 18 U/L (ref 15–41)
Albumin: 4 g/dL (ref 3.5–5.0)
Alkaline Phosphatase: 37 U/L — ABNORMAL LOW (ref 38–126)
Anion gap: 8 (ref 5–15)
BUN: 15 mg/dL (ref 6–20)
CO2: 25 mmol/L (ref 22–32)
Calcium: 8.6 mg/dL — ABNORMAL LOW (ref 8.9–10.3)
Chloride: 104 mmol/L (ref 98–111)
Creatinine, Ser: 0.69 mg/dL (ref 0.44–1.00)
GFR, Estimated: >60 mL/min (ref >60)
Glucose, Bld: 91 mg/dL (ref 70–99)
Potassium: 4 mmol/L (ref 3.5–5.1)
Sodium: 137 mmol/L (ref 135–145)
Total Bilirubin: 0.7 mg/dL (ref 0.3–1.2)
Total Protein: 7.3 g/dL (ref 6.5–8.1)

## 2021-02-08 LAB — PREGNANCY, URINE: Preg Test, Ur: NEGATIVE

## 2021-02-08 LAB — LIPASE, BLOOD: Lipase: 14 U/L (ref 11–51)

## 2021-02-08 MED ORDER — OMEPRAZOLE 20 MG PO CPDR: 20.0000 mg | DELAYED_RELEASE_CAPSULE | Freq: Every day | ORAL | 0 refills | Status: AC

## 2021-02-08 MED ORDER — ONDANSETRON 4 MG PO TBDP: ORAL_TABLET | ORAL | 0 refills | Status: AC

## 2021-02-08 MED ORDER — ONDANSETRON HCL 4 MG/2ML IJ SOLN
4.0000 mg | Freq: Once | INTRAMUSCULAR | Status: DC
  Filled 2021-02-08: qty 2

## 2021-02-08 MED ORDER — SODIUM CHLORIDE 0.9 % IV BOLUS
1000.0000 mL | Freq: Once | INTRAVENOUS | Status: AC
  Administered 2021-02-08: 1000 mL via INTRAVENOUS

## 2021-02-08 MED ORDER — PANTOPRAZOLE SODIUM 40 MG IV SOLR
40.0000 mg | Freq: Once | INTRAVENOUS | Status: AC
  Administered 2021-02-08: 40 mg via INTRAVENOUS
  Filled 2021-02-08: qty 40

## 2021-02-08 NOTE — ED Notes (Signed)
Patient transported to X-ray 

## 2021-02-08 NOTE — ED Notes (Signed)
Pt informed about providing a urine sample.

## 2021-02-08 NOTE — ED Triage Notes (Signed)
Per Konrad Saha, MD, pt is to go directly to ED for further evaluation.

## 2021-02-08 NOTE — Discharge Instructions (Signed)
Follow-up with your primary care doctor if we have any ongoing symptoms.  Return here to the emergency department if you have any worsening symptoms including ongoing vomiting, abdominal pain, vomiting blood, or blood in your stool.

## 2021-02-08 NOTE — ED Notes (Signed)
Pt drinking gatorade for PO challenge

## 2021-02-08 NOTE — ED Triage Notes (Signed)
Pt reports drinking etoh daily x 5 years and today started having the shakes and vomiting. Pt reported vomiting up 1 large blood clot today and was concerned. Pt called pcp and directed to come in for evaluation.

## 2021-02-08 NOTE — ED Notes (Signed)
IV attempted x 2 by another RN and x 1 by this RN without success.

## 2021-02-08 NOTE — ED Provider Notes (Signed)
[facility] EMERGENCY DEPT Provider Note   CSN: [id number] Arrival date & time: 02/08/21  1515     History Chief Complaint  Patient presents with  . Hematemesis    Diana Hudson is a 35 y.o. female.  Patient is a 35 year old female who presents with nausea vomiting and potential bloody emesis.  She has a history of alcohol abuse with daily alcohol use.  She says she normally drinks in the evenings and night and then is fine during the day.  She said that today she suddenly got nauseated around noon and had a large episode of projectile vomiting which was her normal stomach contents.  She then had a second episode of vomiting which was stomach context mixed with some dark appearing fluid which she was concerned was blood.  She did not have any bright red blood.  She has had no further vomiting since that episode which happened at 1 PM.  She denies any abdominal pain.  She does not have any withdrawal symptoms.  She does not have any ongoing nausea.  No associated chest pain or shortness of breath.  No change in her bowel habits.        Past Medical History:  Diagnosis Date  . Alcoholism ([facility])   . Anemia   . Anxiety   . Bronchitis   . Depression   . Ectopic pregnancy   . Hx of varicella     Patient Active Problem List   Diagnosis Date Noted  . IUD failure, pregnant 08/13/2019  . Pregnancy, incidental 08/13/2019  . Ectopic pregnancy 08/13/2019    Past Surgical History:  Procedure Laterality Date  . DIAGNOSTIC LAPAROSCOPY WITH REMOVAL OF ECTOPIC PREGNANCY N/A 08/13/2019   Procedure: DIAGNOSTIC LAPAROSCOPY WITH REMOVAL OF ECTOPIC PREGNANCY;  Surgeon: Carrington Clamp, MD;  Location[facility] OR;  Service: Gynecology;  Laterality: N/A;  . NO PAST SURGERIES       OB History    Gravida  6   Para  3   Term  3   Preterm      AB  2   Living  3     SAB      IAB      Ectopic      Multiple      Live Births  3           Family History  Problem  Relation Age of Onset  . Stroke Mother   . Hypertension Mother   . Migraines Mother   . Seizures Mother   . Thrombophlebitis Mother   . Diabetes Father   . Hyperlipidemia Father   . Hypertension Father   . Vision loss Father        glacoma  . Rashes / Skin problems Brother   . Seizures Brother   . Rheum arthritis Neg Hx   . Osteoarthritis Neg Hx   . Asthma Neg Hx   . Cancer Neg Hx   . Heart failure Neg Hx   . Thyroid disease Neg Hx     Social History   Tobacco Use  . Smoking status: Former Smoker    Packs/day: 0.50    Quit date: 12/03/2007    Years since quitting: 13.1  . Smokeless tobacco: Never Used  . Tobacco comment: in college  Vaping Use  . Vaping Use: Never used  Substance Use Topics  . Alcohol use: Yes    Alcohol/week: 7.0 standard drinks    Types: 7 Glasses of wine per week  Comment: 18 ounces daily of liquor  . Drug use: Yes    Types: Marijuana    Comment: daily    Home Medications Prior to Admission medications   Medication Sig Start Date End Date Taking? Authorizing Provider  FLUoxetine (PROZAC) 10 MG tablet Take 10 mg by mouth daily.   Yes [provider]  omeprazole (PRILOSEC) 20 MG capsule Take 1 capsule (20 mg total) by mouth daily. 02/08/21  Yes Rolan Bucco, MD  ondansetron (ZOFRAN ODT) 4 MG disintegrating tablet 4mg  ODT q4 hours prn nausea/vomit 02/08/21  Yes 04/10/21, MD  docusate sodium (COLACE) 100 MG capsule Take 1 capsule (100 mg total) by mouth 2 (two) times daily. 03/09/15   05/09/15, MD  ibuprofen (ADVIL,MOTRIN) 600 MG tablet Take 1 tablet (600 mg total) by mouth every 6 (six) hours as needed. 03/09/15   05/09/15, MD  IRON PO Take 1 tablet by mouth daily.    [provider]  oxyCODONE-acetaminophen (ROXICET) 5-325 MG tablet Take 1-2 tablets by mouth every 4 (four) hours as needed. May take 1-2 tablets every 4-6 hours as needed for pain 08/13/19   10/13/19, MD  Prenatal Vit-Fe Fumarate-FA (PRENATAL  MULTIVITAMIN) TABS tablet Take 1 tablet by mouth daily at 12 noon.    [provider]    Allergies    Citrus  Review of Systems   Review of Systems  Constitutional: Negative for chills, diaphoresis, fatigue and fever.  HENT: Negative for congestion, rhinorrhea and sneezing.   Eyes: Negative.   Respiratory: Negative for cough, chest tightness and shortness of breath.   Cardiovascular: Negative for chest pain and leg swelling.  Gastrointestinal: Positive for nausea and vomiting. Negative for abdominal pain, blood in stool and diarrhea.  Genitourinary: Negative for difficulty urinating, flank pain, frequency and hematuria.  Musculoskeletal: Negative for arthralgias and back pain.  Skin: Negative for rash.  Neurological: Negative for dizziness, speech difficulty, weakness, numbness and headaches.    Physical Exam Updated Vital Signs BP 117/73   Pulse 72   Temp 98.9 F (37.2 C) (Oral)   Resp 19   Ht 5\' 3"  (1.6 m)   Wt 102.1 kg   LMP 01/18/2021   SpO2 100%   BMI 39.86 kg/m   Physical Exam Constitutional:      Appearance: She is well-developed.  HENT:     Head: Normocephalic and atraumatic.  Eyes:     Pupils: Pupils are equal, round, and reactive to light.  Cardiovascular:     Rate and Rhythm: Normal rate and regular rhythm.     Heart sounds: Normal heart sounds.  Pulmonary:     Effort: Pulmonary effort is normal. No respiratory distress.     Breath sounds: Normal breath sounds. No wheezing or rales.  Chest:     Chest wall: No tenderness.  Abdominal:     General: Bowel sounds are normal.     Palpations: Abdomen is soft.     Tenderness: There is no abdominal tenderness. There is no guarding or rebound.  Musculoskeletal:        General: Normal range of motion.     Cervical back: Normal range of motion and neck supple.  Lymphadenopathy:     Cervical: No cervical adenopathy.  Skin:    General: Skin is warm and dry.     Findings: No rash.  Neurological:      Mental Status: She is alert and oriented to person, place, and time.     ED Results /  Procedures / Treatments   Labs (all labs ordered are listed, but only abnormal results are displayed) Labs Reviewed  URINALYSIS, ROUTINE W REFLEX MICROSCOPIC - Abnormal; Notable for the following components:      Result Value   Protein, ur TRACE (*)    All other components within normal limits  CBC WITH DIFFERENTIAL/PLATELET - Abnormal; Notable for the following components:   Hemoglobin 10.3 (*)    HCT 33.3 (*)    MCV 79.7 (*)    MCH 24.6 (*)    RDW 16.1 (*)    All other components within normal limits  COMPREHENSIVE METABOLIC PANEL - Abnormal; Notable for the following components:   Calcium 8.6 (*)    Alkaline Phosphatase 37 (*)    All other components within normal limits  PREGNANCY, URINE  LIPASE, BLOOD  CBC WITH DIFFERENTIAL/PLATELET    EKG None  Radiology DG Chest 2 View  Result Date: 02/08/2021 CLINICAL DATA:  Cough EXAM: CHEST - 2 VIEW COMPARISON:  December 28, 2013 FINDINGS: The heart size and mediastinal contours are within normal limits. Both lungs are clear. The visualized skeletal structures are unremarkable. IMPRESSION: No active cardiopulmonary disease. Electronically Signed   By: Jonna Clark M.D.   On: 02/08/2021 16:43    Procedures Procedures   Medications Ordered in ED Medications  ondansetron (ZOFRAN) injection 4 mg (4 mg Intravenous Patient Refused/Not Given 02/08/21 1705)  pantoprazole (PROTONIX) injection 40 mg (40 mg Intravenous Given 02/08/21 1704)  sodium chloride 0.9 % bolus 1,000 mL ( Intravenous Stopped 02/08/21 1810)    ED Course  I have reviewed the triage vital signs and the nursing notes.  Pertinent labs & imaging results that were available during my care of the patient were reviewed by me and considered in my medical decision making (see chart for details).    MDM Rules/Calculators/A&P                          Patient is a 35 year old female who  presents with vomiting and questionable episode of blood in her emesis.  She has no associate abdominal pain.  Her labs are nonconcerning.  Her hemoglobin is slightly low but actually better than prior values.  She has no suggestions of pancreatitis.  She has no ongoing vomiting.  She has not had any emesis in the last 5 to 6 hours.  Repeat abdominal exam is benign.  She is able to tolerate oral fluids without nausea or vomiting.  Chest x-ray shows no free air or other acute abnormality.  This was reviewed by me.  She was discharged home in good condition.  She was given a prescription for Zofran and Protonix.  She was advised to avoid alcohol and acid producing foods.  She also was given resources for possible follow-up regarding alcohol cessation.  She request interest in stopping alcohol use.  Peer support consult was also obtained.  Return precautions were given. Final Clinical Impression(s) / ED Diagnoses Final diagnoses:  Non-intractable vomiting with nausea, unspecified vomiting type    Rx / DC Orders ED Discharge Orders         Ordered    omeprazole (PRILOSEC) 20 MG capsule  Daily        02/08/21 1921    ondansetron (ZOFRAN ODT) 4 MG disintegrating tablet        02/08/21 1921           Rolan Bucco, MD 02/08/21 1924

## 2021-09-13 ENCOUNTER — Ambulatory Visit
Admission: EM | Admit: 2021-09-13 | Discharge: 2021-09-13 | Disposition: A | Discharge status: Home / Self Care | Payer: BC Managed Care – PPO | Attending: Emergency Medicine | Admitting: Emergency Medicine

## 2021-09-13 ENCOUNTER — Other Ambulatory Visit: Payer: Self-pay

## 2021-09-13 DIAGNOSIS — Z202 Contact with and (suspected) exposure to infections with a predominantly sexual mode of transmission: Secondary | ICD-10-CM | POA: Insufficient documentation

## 2021-09-13 DIAGNOSIS — J358 Other chronic diseases of tonsils and adenoids: Secondary | ICD-10-CM | POA: Diagnosis not present

## 2021-09-13 LAB — POCT RAPID STREP A (OFFICE): Rapid Strep A Screen: NEGATIVE

## 2021-09-13 MED ORDER — DOXYCYCLINE HYCLATE 100 MG PO CAPS: 100.0000 mg | ORAL_CAPSULE | Freq: Two times a day (BID) | ORAL | 0 refills | Status: AC

## 2021-09-13 MED ORDER — CEFTRIAXONE SODIUM 500 MG IJ SOLR
500.0000 mg | Freq: Once | INTRAMUSCULAR | Status: AC
  Administered 2021-09-13: 500 mg via INTRAMUSCULAR

## 2021-09-13 NOTE — ED Provider Notes (Signed)
[facility]    CSN: [id number] Arrival date & time: 09/13/21  0847      History   Chief Complaint Chief Complaint  Patient presents with   patches to throat    HPI Diana Hudson is a 35 y.o. female.   Patient presents with a chief complaint of possible STD exposure secondary to unprotected oral sex accompanied by a complicated history of being engaged in also being involved with a married man.  Patient states her only symptom are some small white patches that she noticed this morning on both of her tonsils.  Patient reports a history of HSV infection since she was a very little girl, states she always gets outbreaks in the same spot in the middle of her lower lip, states this is present at this time.  Patient denies sore throat, fever, aches, chills, nausea, vomiting, diarrhea, headache, chest pain, cough, shortness of breath, genital lesion, unusual vaginal discharge, dyspareunia, burning with urination, inguinal pain, pelvic pain.  Patient also denies a history of STD.  The history is provided by the patient.   Past Medical History:  Diagnosis Date   Alcoholism (HCC)    Anemia    Anxiety    Bronchitis    Depression    Ectopic pregnancy    Hx of varicella     Patient Active Problem List   Diagnosis Date Noted   IUD failure, pregnant 08/13/2019   Pregnancy, incidental 08/13/2019   Ectopic pregnancy 08/13/2019    Past Surgical History:  Procedure Laterality Date   DIAGNOSTIC LAPAROSCOPY WITH REMOVAL OF ECTOPIC PREGNANCY N/A 08/13/2019   Procedure: DIAGNOSTIC LAPAROSCOPY WITH REMOVAL OF ECTOPIC PREGNANCY;  Surgeon: Carrington Clamp, MD;  Location: [facility] OR;  Service: Gynecology;  Laterality: N/A;   NO PAST SURGERIES      OB History     Gravida  6   Para  3   Term  3   Preterm      AB  2   Living  3      SAB      IAB      Ectopic      Multiple      Live Births  3            Home Medications    Prior to Admission medications    Medication Sig Start Date End Date Taking? Authorizing Provider  doxycycline (VIBRAMYCIN) 100 MG capsule Take 1 capsule (100 mg total) by mouth 2 (two) times daily for 7 days. 09/13/21 09/20/21 Yes Theadora Rama Scales, PA-C  docusate sodium (COLACE) 100 MG capsule Take 1 capsule (100 mg total) by mouth 2 (two) times daily. 03/09/15   Waynard Reeds, MD  FLUoxetine (PROZAC) 10 MG tablet Take 10 mg by mouth daily.    [provider]  ibuprofen (ADVIL,MOTRIN) 600 MG tablet Take 1 tablet (600 mg total) by mouth every 6 (six) hours as needed. 03/09/15   Waynard Reeds, MD  IRON PO Take 1 tablet by mouth daily.    [provider]  omeprazole (PRILOSEC) 20 MG capsule Take 1 capsule (20 mg total) by mouth daily. 02/08/21   Rolan Bucco, MD  ondansetron (ZOFRAN ODT) 4 MG disintegrating tablet 4mg  ODT q4 hours prn nausea/vomit 02/08/21   04/10/21, MD  oxyCODONE-acetaminophen (ROXICET) 5-325 MG tablet Take 1-2 tablets by mouth every 4 (four) hours as needed. May take 1-2 tablets every 4-6 hours as needed for pain 08/13/19   10/13/19, MD  Prenatal Vit-Fe  Fumarate-FA (PRENATAL MULTIVITAMIN) TABS tablet Take 1 tablet by mouth daily at 12 noon.    [provider]    Family History Family History  Problem Relation Age of Onset   Stroke Mother    Hypertension Mother    Migraines Mother    Seizures Mother    Thrombophlebitis Mother    Diabetes Father    Hyperlipidemia Father    Hypertension Father    Vision loss Father        glacoma   Rashes / Skin problems Brother    Seizures Brother    Rheum arthritis Neg Hx    Osteoarthritis Neg Hx    Asthma Neg Hx    Cancer Neg Hx    Heart failure Neg Hx    Thyroid disease Neg Hx     Social History Social History   Tobacco Use   Smoking status: Former    Packs/day: 0.50    Types: Cigarettes    Quit date: 12/03/2007    Years since quitting: 13.7   Smokeless tobacco: Never   Tobacco comments:    in college  Vaping  Use   Vaping Use: Never used  Substance Use Topics   Alcohol use: Yes    Alcohol/week: 7.0 standard drinks    Types: 7 Glasses of wine per week    Comment: 18 ounces daily of liquor   Drug use: Yes    Types: Marijuana    Comment: daily     Allergies   Citrus   Review of Systems Review of Systems Pertinent findings noted in history of present illness.    Physical Exam Triage Vital Signs ED Triage Vitals  Enc Vitals Group     BP      Pulse      Resp      Temp      Temp src      SpO2      Weight      Height      Head Circumference      Peak Flow      Pain Score      Pain Loc      Pain Edu?      Excl. in GC?    No data found.  Updated Vital Signs BP 113/77 (BP Location: Right Arm)   Pulse 77   Temp 99.5 F (37.5 C) (Oral)   Resp 20   Wt 229 lb 8 oz (104.1 kg)   LMP 08/30/2021   SpO2 98%   BMI 40.65 kg/m   Visual Acuity Right Eye Distance:   Left Eye Distance:   Bilateral Distance:    Right Eye Near:   Left Eye Near:    Bilateral Near:     Physical Exam Vitals and nursing note reviewed.  Constitutional:      Appearance: Normal appearance.  HENT:     Head: Normocephalic and atraumatic.     Right Ear: Tympanic membrane, ear canal and external ear normal.     Left Ear: Tympanic membrane, ear canal and external ear normal.     Nose: Nose normal.     Mouth/Throat:     Lips: Pink.     Mouth: Mucous membranes are moist.     Pharynx: Oropharynx is clear. Uvula midline. No pharyngeal swelling, oropharyngeal exudate, posterior oropharyngeal erythema or uvula swelling.     Tonsils: Tonsillar exudate present. No tonsillar abscesses. 2+ on the right. 2+ on the left.     Comments: Herpetic lesion  apparent the center of bottom lip, not yet deroofed Eyes:     Extraocular Movements: Extraocular movements intact.     Conjunctiva/sclera: Conjunctivae normal.     Pupils: Pupils are equal, round, and reactive to light.  Cardiovascular:     Rate and Rhythm:  Normal rate and regular rhythm.     Pulses: Normal pulses.     Heart sounds: Normal heart sounds.  Pulmonary:     Effort: Pulmonary effort is normal.     Breath sounds: Normal breath sounds.  Musculoskeletal:        General: Normal range of motion.     Cervical back: Normal range of motion and neck supple. No tenderness.  Lymphadenopathy:     Cervical: No cervical adenopathy.  Skin:    General: Skin is warm and dry.  Neurological:     General: No focal deficit present.     Mental Status: She is alert and oriented to person, place, and time. Mental status is at baseline.  Psychiatric:        Mood and Affect: Mood normal.        Behavior: Behavior normal.     UC Treatments / Results  Labs (all labs ordered are listed, but only abnormal results are displayed) Labs Reviewed  CULTURE, GROUP A STREP [facility])  POCT RAPID STREP A (OFFICE)  CYTOLOGY, (ORAL, ANAL, URETHRAL) ANCILLARY ONLY    EKG   Radiology No results found.  Procedures Procedures (including critical care time)  Medications Ordered in UC Medications  cefTRIAXone (ROCEPHIN) injection 500 mg (500 mg Intramuscular Given 09/13/21 1012)    Initial Impression / Assessment and Plan / UC Course  I have reviewed the triage vital signs and the nursing notes.  Pertinent labs & imaging results that were available during my care of the patient were reviewed by me and considered in my medical decision making (see chart for details).     Given patient's asymptomatic exudate on both tonsils and history provided, I feel is appropriate to go ahead and treat her empirically for gonorrhea with concomitant chlamydia.  Patient was given an injection of ceftriaxone and a prescription for doxycycline was sent to her pharmacy.  Oral STD culture was obtained, patient will be notified of results once received..  Rapid strep test today was negative.  We will also send throat culture to rule out strep completely, we will also notified the  patient of these results and if positive will treat as indicated.  Patient verbalized understanding and agreement of plan as discussed.  All questions were addressed during visit.  Please see discharge instructions below for further details of plan.  Final Clinical Impressions(s) / UC Diagnoses   Final diagnoses:  Potential exposure to STD  Tonsillar exudate   Discharge Instructions   None    ED Prescriptions     Medication Sig Dispense Auth. Provider   doxycycline (VIBRAMYCIN) 100 MG capsule Take 1 capsule (100 mg total) by mouth 2 (two) times daily for 7 days. 14 capsule Theadora Rama Scales, PA-C      PDMP not reviewed this encounter.   Theadora Rama Scales, PA-C 09/13/21 1344

## 2021-09-13 NOTE — ED Triage Notes (Signed)
Patient reports this morning noticing white patches on her tonsils and a bump to her lower lip. Patient denies having sore throat and denies itching in the throat.  Patient is verbal and requests to have oral STD testing and strep testing.

## 2021-09-14 ENCOUNTER — Telehealth: Payer: Self-pay

## 2021-09-14 NOTE — Telephone Encounter (Signed)
Returned patients phone call, patient made aware that lab results from visit are still in process.

## 2021-09-16 LAB — CULTURE, GROUP A STREP (THRC)
